# Patient Record
Sex: Female | Born: 1935 | Race: White | Hispanic: No | Smoking: Never smoker
Health system: Southern US, Community
[De-identification: ages and names within clinical notes are randomized; demographics above are authoritative.]

## PROBLEM LIST (undated history)

## (undated) DIAGNOSIS — IMO0001 Reserved for inherently not codable concepts without codable children: Secondary | ICD-10-CM

## (undated) DIAGNOSIS — I2510 Atherosclerotic heart disease of native coronary artery without angina pectoris: Secondary | ICD-10-CM

## (undated) DIAGNOSIS — N189 Chronic kidney disease, unspecified: Secondary | ICD-10-CM

## (undated) DIAGNOSIS — I4891 Unspecified atrial fibrillation: Secondary | ICD-10-CM

## (undated) DIAGNOSIS — K219 Gastro-esophageal reflux disease without esophagitis: Secondary | ICD-10-CM

## (undated) DIAGNOSIS — I509 Heart failure, unspecified: Secondary | ICD-10-CM

## (undated) DIAGNOSIS — J449 Chronic obstructive pulmonary disease, unspecified: Secondary | ICD-10-CM

## (undated) DIAGNOSIS — I10 Essential (primary) hypertension: Secondary | ICD-10-CM

## (undated) DIAGNOSIS — I209 Angina pectoris, unspecified: Secondary | ICD-10-CM

## (undated) DIAGNOSIS — R011 Cardiac murmur, unspecified: Secondary | ICD-10-CM

## (undated) HISTORY — PX: TUBAL LIGATION: SHX77

## (undated) HISTORY — PX: KNEE SURGERY: SHX244

## (undated) HISTORY — PX: CHOLECYSTECTOMY: SHX55

## (undated) HISTORY — PX: ABDOMINAL HYSTERECTOMY: SHX81

## (undated) HISTORY — PX: CORONARY ANGIOPLASTY: SHX604

---

## 2003-06-06 ENCOUNTER — Other Ambulatory Visit: Payer: Self-pay

## 2003-10-23 ENCOUNTER — Other Ambulatory Visit: Payer: Self-pay

## 2005-01-11 ENCOUNTER — Other Ambulatory Visit: Payer: Self-pay

## 2005-01-11 ENCOUNTER — Inpatient Hospital Stay: Payer: Self-pay | Admitting: Infectious Diseases

## 2005-01-20 ENCOUNTER — Inpatient Hospital Stay: Payer: Self-pay | Admitting: Internal Medicine

## 2005-05-07 ENCOUNTER — Emergency Department: Payer: Self-pay | Admitting: Emergency Medicine

## 2005-05-07 ENCOUNTER — Ambulatory Visit: Payer: Self-pay | Admitting: Family Medicine

## 2005-05-08 ENCOUNTER — Other Ambulatory Visit: Payer: Self-pay

## 2005-05-13 ENCOUNTER — Other Ambulatory Visit: Payer: Self-pay

## 2005-05-13 ENCOUNTER — Inpatient Hospital Stay: Payer: Self-pay | Admitting: Internal Medicine

## 2005-06-21 ENCOUNTER — Other Ambulatory Visit: Payer: Self-pay

## 2005-06-21 ENCOUNTER — Inpatient Hospital Stay: Payer: Self-pay | Admitting: Internal Medicine

## 2005-08-02 ENCOUNTER — Ambulatory Visit: Payer: Self-pay | Admitting: Family Medicine

## 2006-01-25 ENCOUNTER — Ambulatory Visit: Payer: Self-pay | Admitting: Family Medicine

## 2006-10-27 IMAGING — CR METASTATIC BONE SURVEY
1 series · 8 of 8 positions shown · non-contrast
Comparison: none

REASON FOR EXAM: anemia, hypercalcemia, renal insufficiency, r/o multiple
myeloma
COMMENTS:  LMP: Post-Menopausal

PROCEDURE:     DXR - DXR BONE SURVEY COMPLETE  - January 14, 2005 [DATE]
RESULT:     Multiple images are obtained of the entire axial and
appendicular skeleton.  No significant bony abnormalities or lucencies are
noted to suggest multiple myeloma.

[Series 1: view not recorded · 0.17mm/px · 8 of 20 slices shown]
[im 1/20]
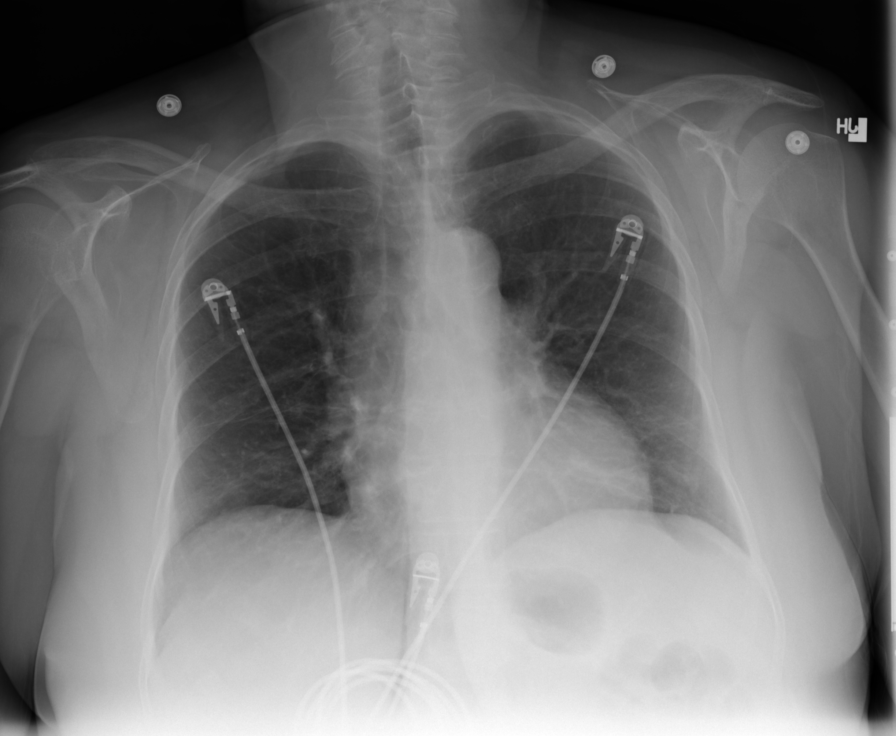
[im 3/20]
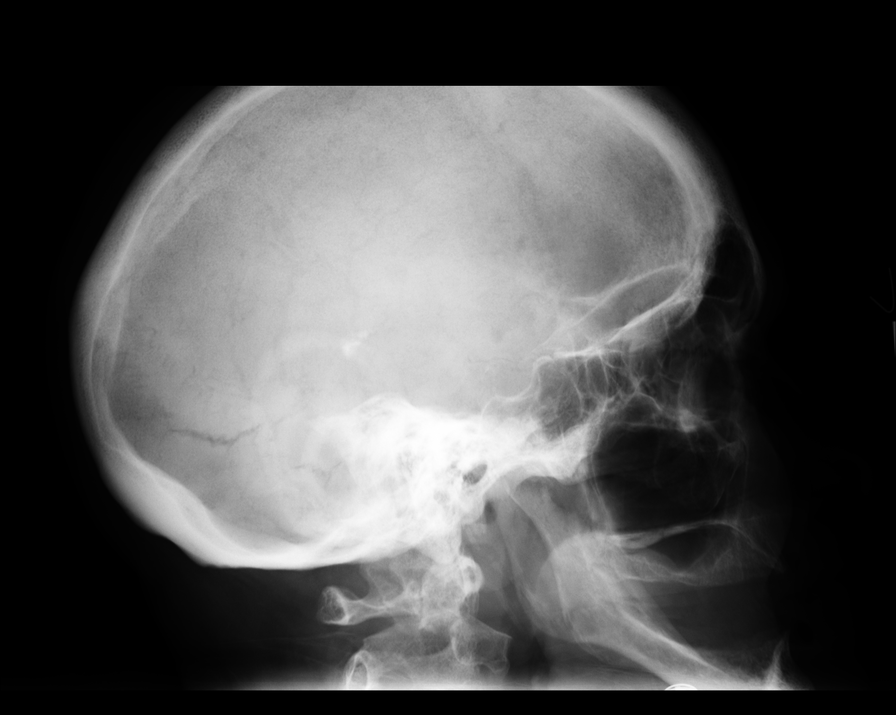
[im 6/20]
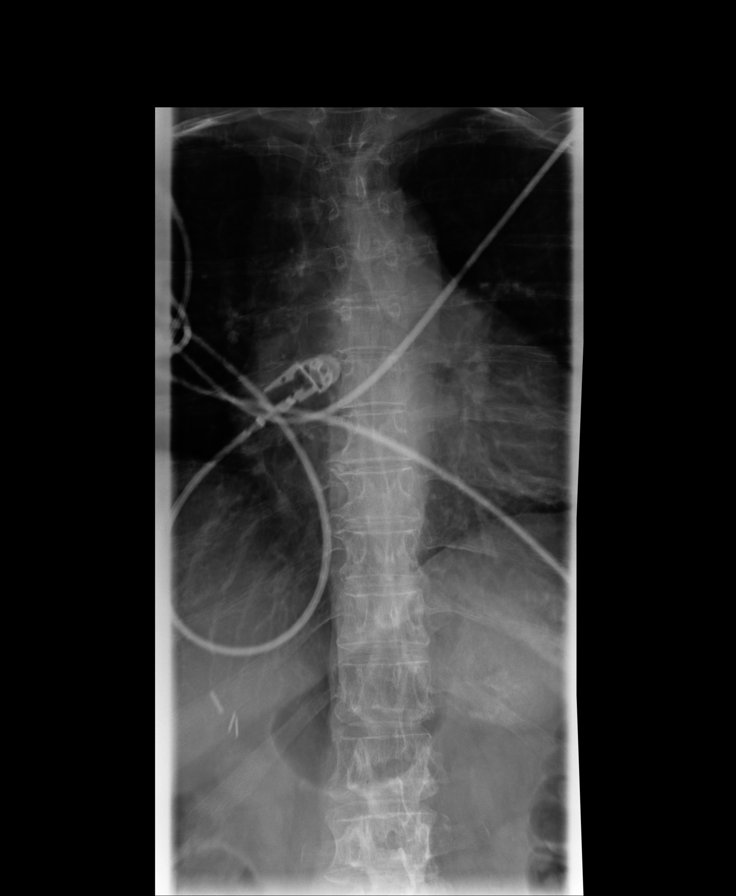
[im 9/20]
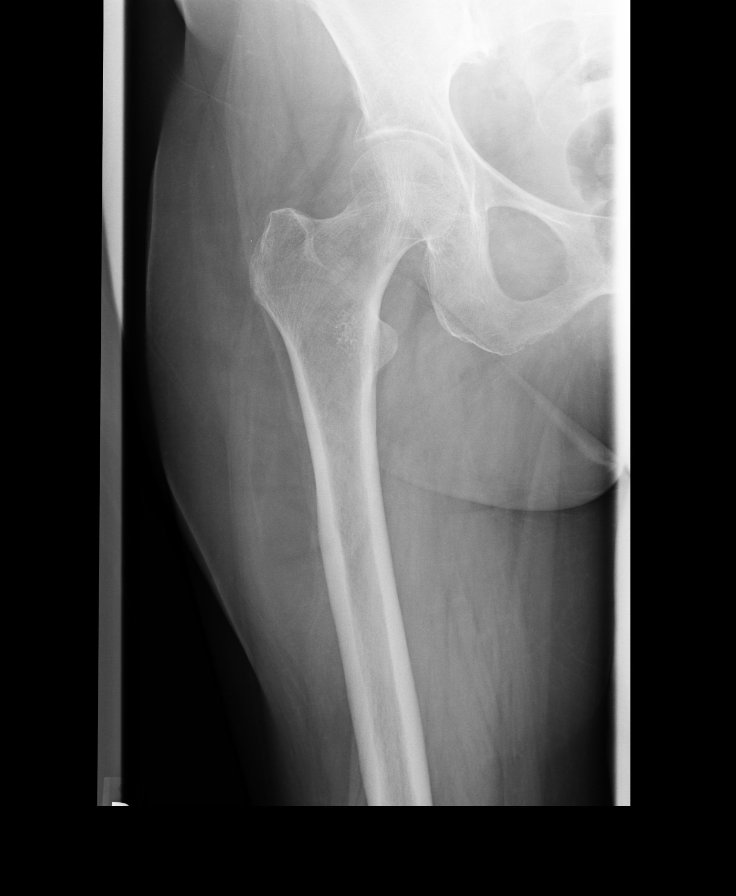
[im 11/20]
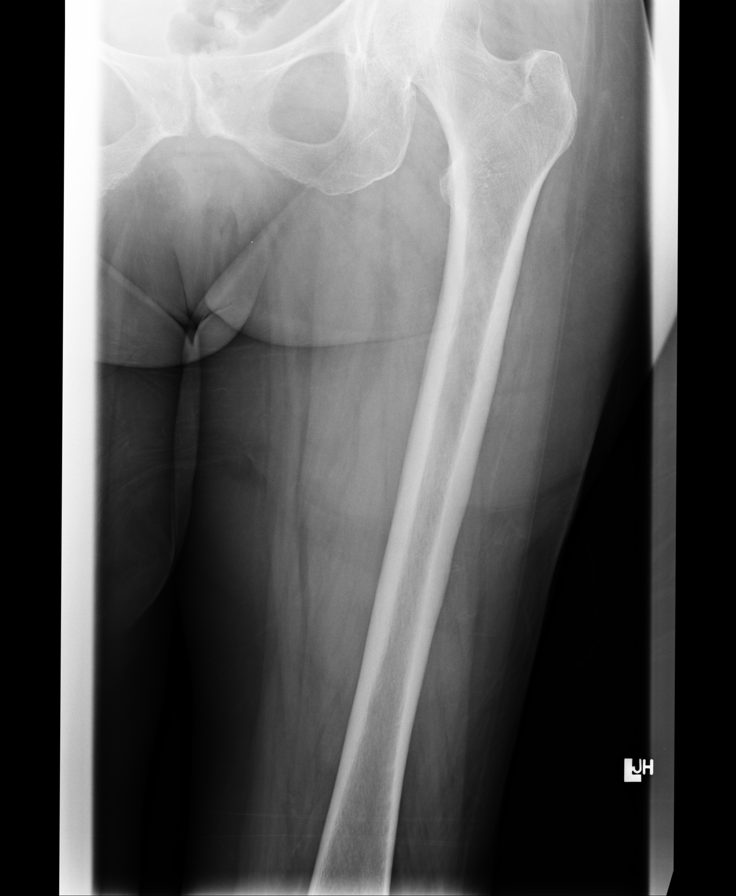
[im 14/20]
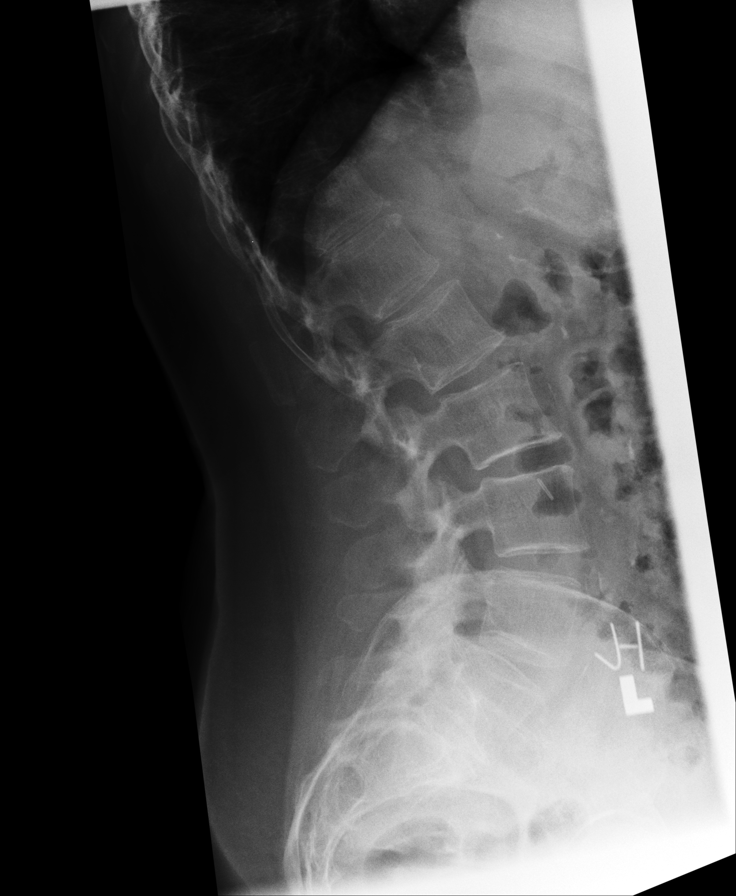
[im 17/20]
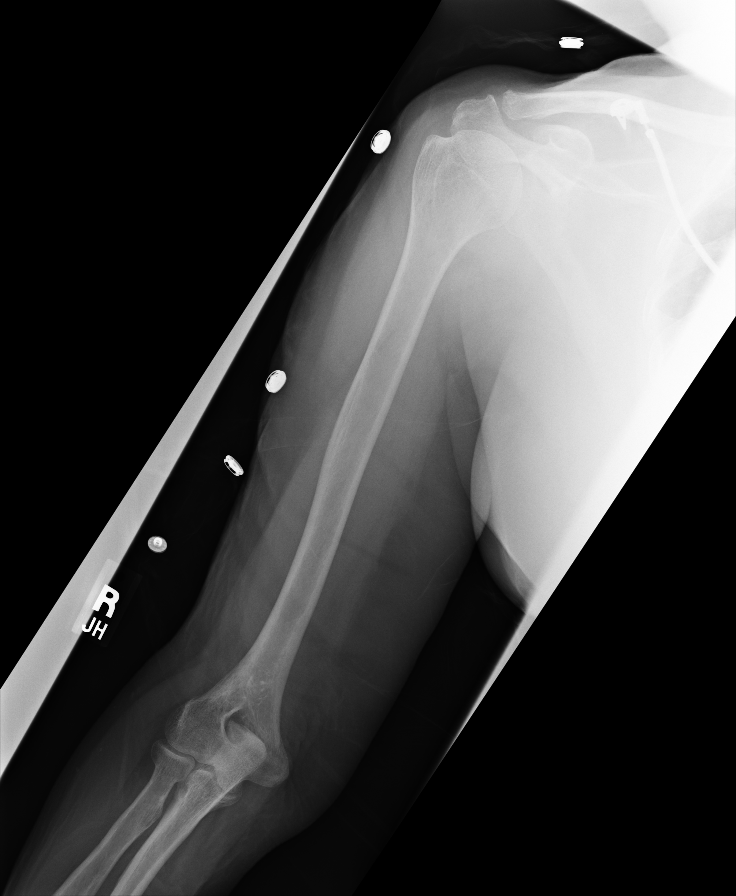
[im 20/20]
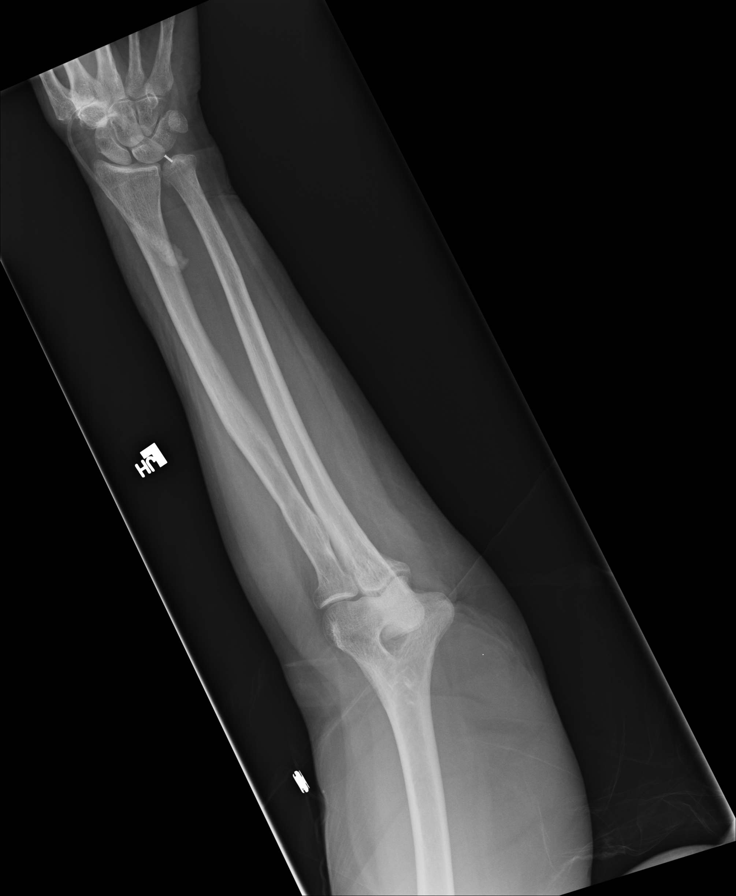

[8 of 8 positions shown; findings below may reference images not displayed]

IMPRESSION: 1)No definite evidence of multiple myeloma.

## 2006-10-28 IMAGING — US US RENAL KIDNEY
1 series · 17 of 23 positions shown · non-contrast
Comparison: none

REASON FOR EXAM: Renal failure
COMMENTS:

[Series 1: us renal kidney · 17 of 23 slices shown]
[im 1/23]
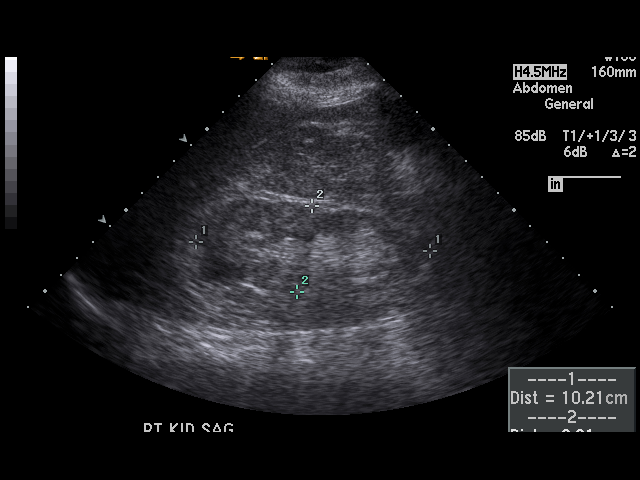
[im 3/23]
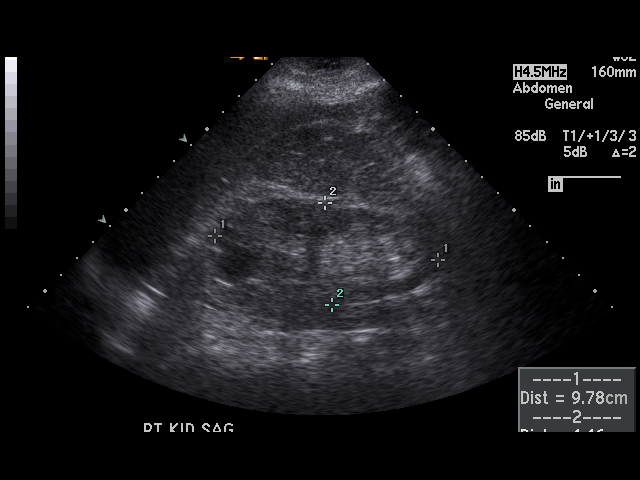
[im 4/23]
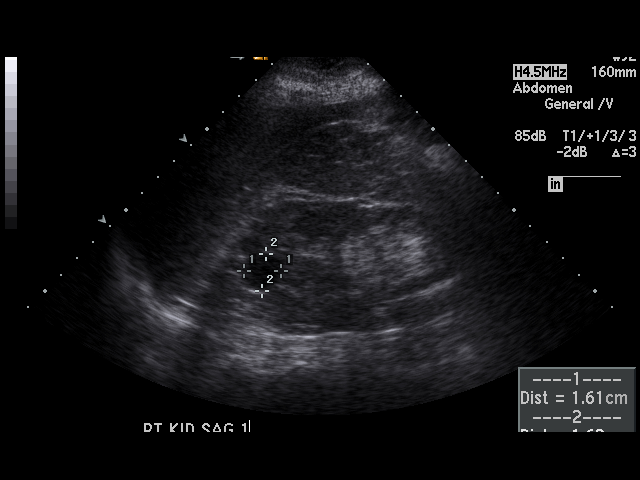
[im 5/23]
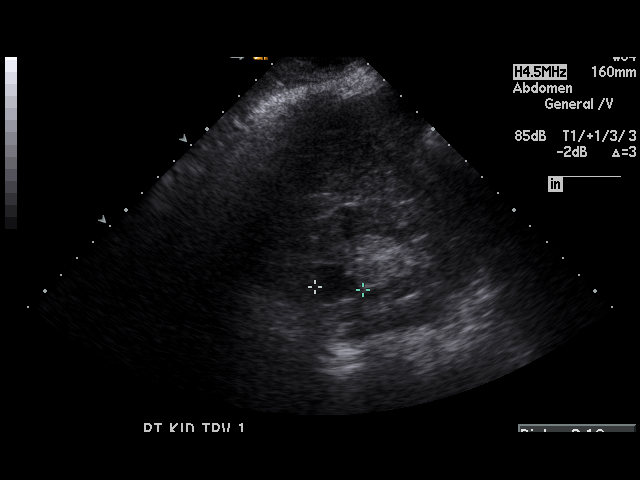
[im 7/23]
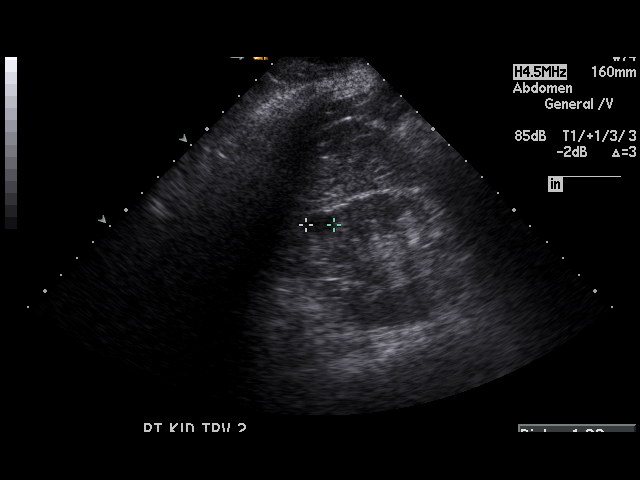
[im 8/23]
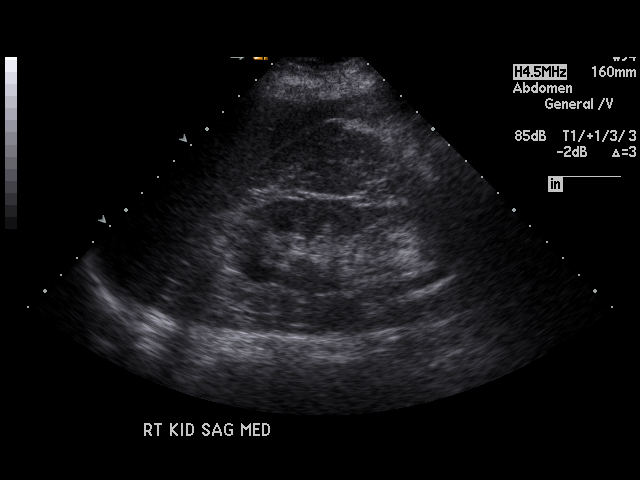
[im 9/23]
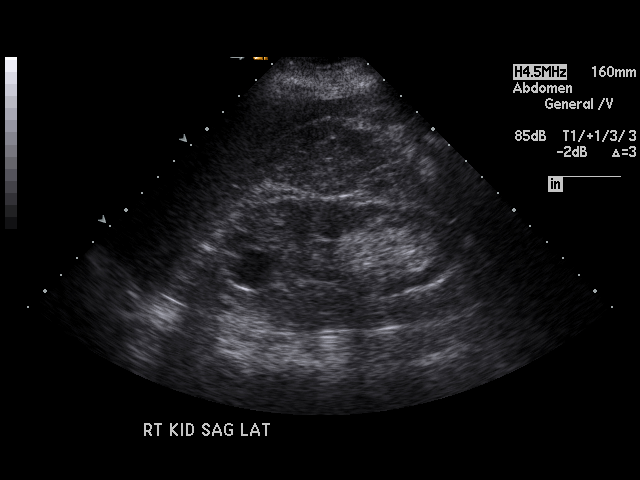
[im 11/23]
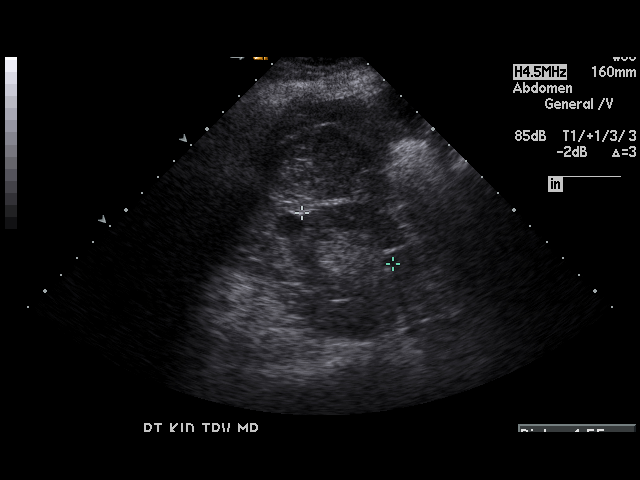
[im 12/23]
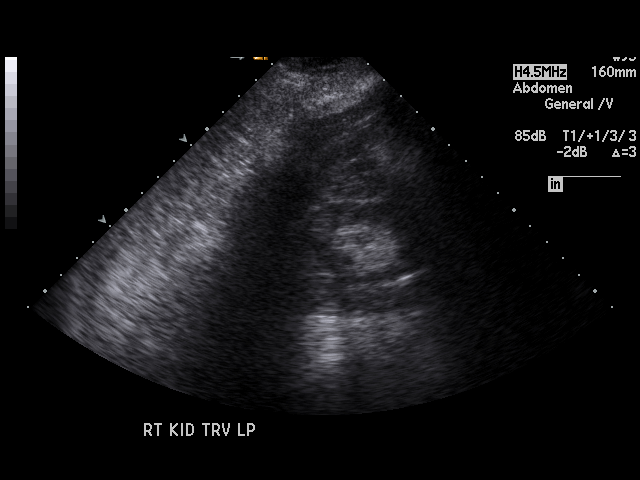
[im 13/23]
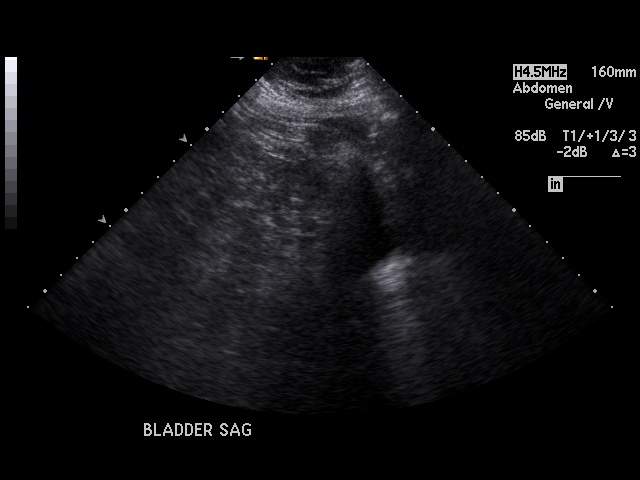
[im 15/23]
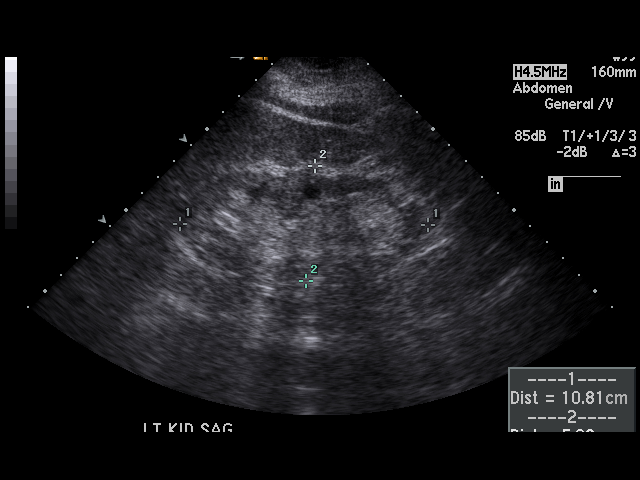
[im 16/23]
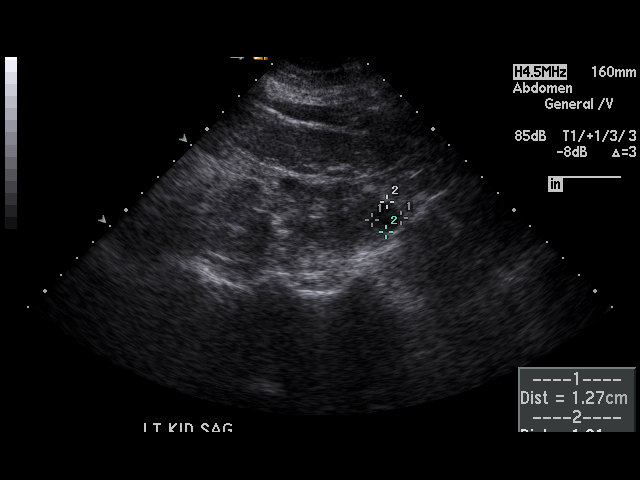
[im 17/23]
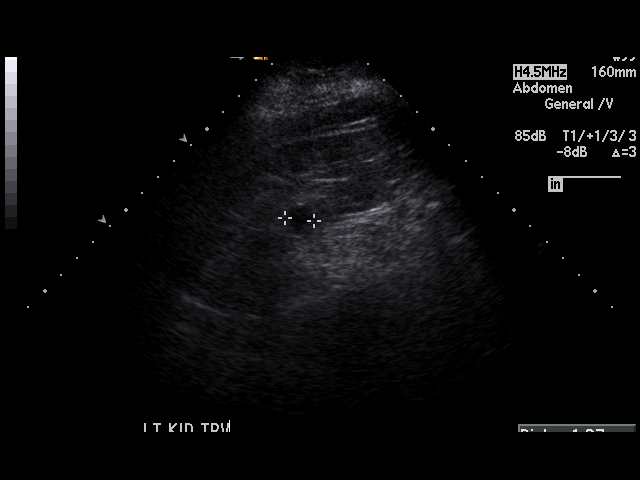
[im 19/23]
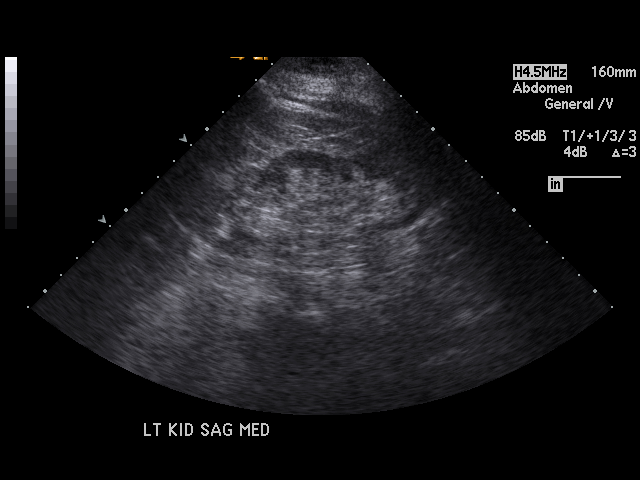
[im 20/23]
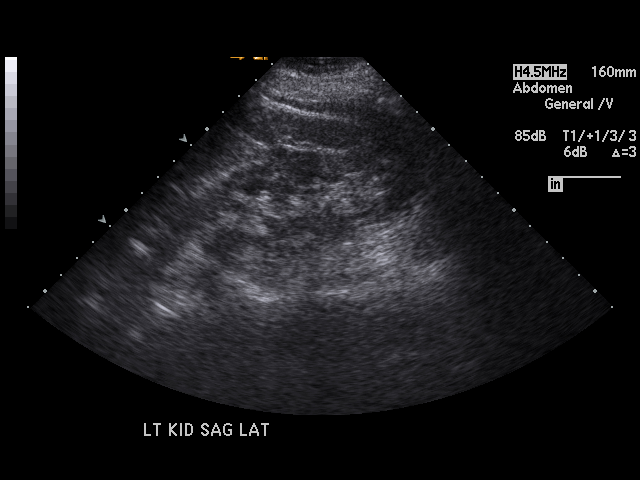
[im 21/23]
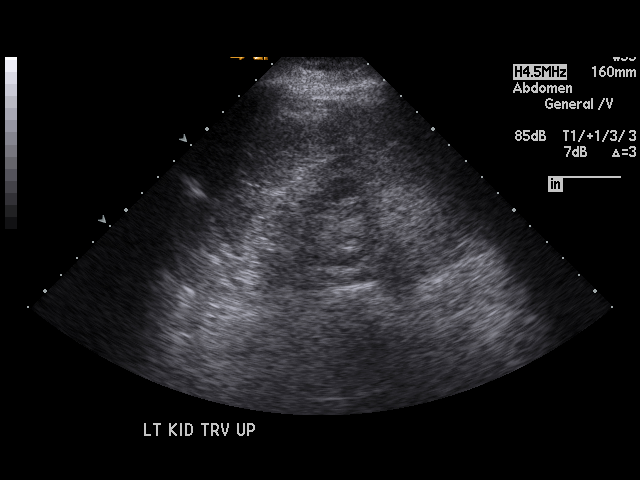
[im 23/23]
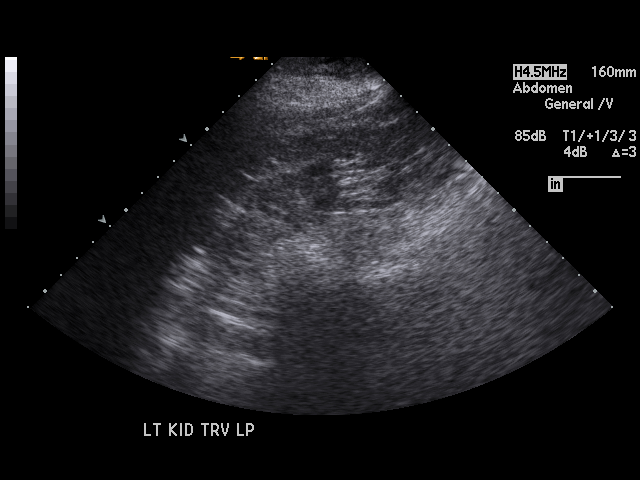

[17 of 23 positions shown; findings below may reference images not displayed]

PROCEDURE:     US  - US KIDNEY BILATERAL  - January 15, 2005  [DATE]

RESULT:     Real-time imaging was obtained.  The RIGHT kidney measures
x 4.4 x 4.6 cm while the LEFT kidney measures 10.8 x 5.0 x 4.3 cm.  Within
the RIGHT kidney are two cysts, one in the upper pole measuring 1.6 x 1.6 x
2.1 cm and the other in the midpole measuring .94 x 1.1 x 1.2 cm.  In the
lower pole of the LEFT kidney is a cyst measuring 1.27 x 1.3 x 1.27.  The
kidneys appear somewhat echogenic and may reflect medical renal disease.
IMPRESSION: No hydronephrosis is identified.

Small cysts are noted bilaterally.

## 2006-11-18 ENCOUNTER — Inpatient Hospital Stay: Payer: Self-pay | Admitting: Internal Medicine

## 2006-11-18 ENCOUNTER — Other Ambulatory Visit: Payer: Self-pay

## 2007-01-27 ENCOUNTER — Ambulatory Visit: Payer: Self-pay | Admitting: Family Medicine

## 2007-07-08 ENCOUNTER — Ambulatory Visit: Payer: Self-pay | Admitting: Urology

## 2007-10-07 ENCOUNTER — Other Ambulatory Visit: Payer: Self-pay

## 2007-10-07 ENCOUNTER — Inpatient Hospital Stay: Payer: Self-pay | Admitting: Internal Medicine

## 2007-10-08 ENCOUNTER — Other Ambulatory Visit: Payer: Self-pay

## 2008-01-29 ENCOUNTER — Ambulatory Visit: Payer: Self-pay | Admitting: Family Medicine

## 2009-01-31 ENCOUNTER — Ambulatory Visit: Payer: Self-pay | Admitting: Family Medicine

## 2009-12-13 ENCOUNTER — Inpatient Hospital Stay: Payer: Self-pay | Admitting: Internal Medicine

## 2010-05-26 ENCOUNTER — Ambulatory Visit: Payer: Self-pay | Admitting: Family Medicine

## 2010-07-04 ENCOUNTER — Ambulatory Visit: Payer: Self-pay | Admitting: Family Medicine

## 2010-07-17 ENCOUNTER — Emergency Department: Payer: Self-pay | Admitting: Emergency Medicine

## 2011-02-15 ENCOUNTER — Ambulatory Visit: Payer: Self-pay | Admitting: Family Medicine

## 2011-03-23 ENCOUNTER — Ambulatory Visit: Payer: Self-pay | Admitting: Unknown Physician Specialty

## 2011-03-27 LAB — PATHOLOGY REPORT

## 2011-06-07 ENCOUNTER — Ambulatory Visit: Payer: Self-pay | Admitting: Family Medicine

## 2011-06-13 ENCOUNTER — Ambulatory Visit: Payer: Self-pay | Admitting: Family Medicine

## 2012-06-30 ENCOUNTER — Ambulatory Visit: Payer: Self-pay | Admitting: Family Medicine

## 2013-06-24 ENCOUNTER — Emergency Department: Payer: Self-pay | Admitting: Internal Medicine

## 2013-06-24 LAB — URINALYSIS, COMPLETE
Bilirubin,UR: NEGATIVE
GLUCOSE, UR: NEGATIVE mg/dL (ref 0–75)
Hyaline Cast: 1
Nitrite: NEGATIVE
Ph: 7 (ref 4.5–8.0)
Protein: 100
RBC,UR: 4 /HPF (ref 0–5)
Specific Gravity: 1.009 (ref 1.003–1.030)
Squamous Epithelial: NONE SEEN
WBC UR: 19 /HPF (ref 0–5)

## 2013-06-24 LAB — COMPREHENSIVE METABOLIC PANEL
AST: 32 U/L (ref 15–37)
Albumin: 4.4 g/dL (ref 3.4–5.0)
Alkaline Phosphatase: 134 U/L — ABNORMAL HIGH
Anion Gap: 7 (ref 7–16)
BILIRUBIN TOTAL: 0.5 mg/dL (ref 0.2–1.0)
BUN: 16 mg/dL (ref 7–18)
Calcium, Total: 10 mg/dL (ref 8.5–10.1)
Chloride: 103 mmol/L (ref 98–107)
Co2: 30 mmol/L (ref 21–32)
Creatinine: 0.94 mg/dL (ref 0.60–1.30)
GFR CALC NON AF AMER: 56 — AB
GLUCOSE: 158 mg/dL — AB (ref 65–99)
Osmolality: 284 (ref 275–301)
Potassium: 3.2 mmol/L — ABNORMAL LOW (ref 3.5–5.1)
SGPT (ALT): 16 U/L (ref 12–78)
SODIUM: 140 mmol/L (ref 136–145)
TOTAL PROTEIN: 8.6 g/dL — AB (ref 6.4–8.2)

## 2013-06-24 LAB — CBC WITH DIFFERENTIAL/PLATELET
BASOS PCT: 0.4 %
Basophil #: 0 10*3/uL (ref 0.0–0.1)
EOS ABS: 0 10*3/uL (ref 0.0–0.7)
EOS PCT: 0 %
HCT: 43.8 % (ref 35.0–47.0)
HGB: 14.7 g/dL (ref 12.0–16.0)
Lymphocyte #: 0.7 10*3/uL — ABNORMAL LOW (ref 1.0–3.6)
Lymphocyte %: 9.7 %
MCH: 29.5 pg (ref 26.0–34.0)
MCHC: 33.5 g/dL (ref 32.0–36.0)
MCV: 88 fL (ref 80–100)
Monocyte #: 0.2 x10 3/mm (ref 0.2–0.9)
Monocyte %: 2.9 %
NEUTROS PCT: 87 %
Neutrophil #: 6.1 10*3/uL (ref 1.4–6.5)
Platelet: 187 10*3/uL (ref 150–440)
RBC: 4.96 10*6/uL (ref 3.80–5.20)
RDW: 13.8 % (ref 11.5–14.5)
WBC: 7 10*3/uL (ref 3.6–11.0)

## 2013-06-24 LAB — LIPASE, BLOOD: Lipase: 113 U/L (ref 73–393)

## 2013-08-15 ENCOUNTER — Observation Stay: Payer: Self-pay | Admitting: Internal Medicine

## 2013-08-15 ENCOUNTER — Other Ambulatory Visit: Payer: Self-pay | Admitting: Family Medicine

## 2013-08-15 LAB — COMPREHENSIVE METABOLIC PANEL
ANION GAP: 7 (ref 7–16)
Albumin: 3.2 g/dL — ABNORMAL LOW (ref 3.4–5.0)
Alkaline Phosphatase: 79 U/L
BUN: 24 mg/dL — AB (ref 7–18)
Bilirubin,Total: 0.4 mg/dL (ref 0.2–1.0)
CREATININE: 1.63 mg/dL — AB (ref 0.60–1.30)
Calcium, Total: 8.2 mg/dL — ABNORMAL LOW (ref 8.5–10.1)
Chloride: 108 mmol/L — ABNORMAL HIGH (ref 98–107)
Co2: 25 mmol/L (ref 21–32)
EGFR (African American): 33 — ABNORMAL LOW
GFR CALC NON AF AMER: 29 — AB
GLUCOSE: 99 mg/dL (ref 65–99)
Osmolality: 283 (ref 275–301)
Potassium: 3.3 mmol/L — ABNORMAL LOW (ref 3.5–5.1)
SGOT(AST): 41 U/L — ABNORMAL HIGH (ref 15–37)
SGPT (ALT): 16 U/L (ref 12–78)
SODIUM: 140 mmol/L (ref 136–145)
TOTAL PROTEIN: 6.9 g/dL (ref 6.4–8.2)

## 2013-08-15 LAB — URINALYSIS, COMPLETE
BACTERIA: NONE SEEN
Bilirubin,UR: NEGATIVE
Blood: NEGATIVE
Glucose,UR: NEGATIVE mg/dL (ref 0–75)
Ketone: NEGATIVE
LEUKOCYTE ESTERASE: NEGATIVE
Nitrite: NEGATIVE
Ph: 5 (ref 4.5–8.0)
Protein: 30
RBC,UR: 2 /HPF (ref 0–5)
Specific Gravity: 1.021 (ref 1.003–1.030)
WBC UR: 6 /HPF (ref 0–5)

## 2013-08-15 LAB — CBC
HCT: 36.6 % (ref 35.0–47.0)
HGB: 12.5 g/dL (ref 12.0–16.0)
MCH: 30.4 pg (ref 26.0–34.0)
MCHC: 34.1 g/dL (ref 32.0–36.0)
MCV: 89 fL (ref 80–100)
Platelet: 107 10*3/uL — ABNORMAL LOW (ref 150–440)
RBC: 4.11 10*6/uL (ref 3.80–5.20)
RDW: 14 % (ref 11.5–14.5)
WBC: 3.2 10*3/uL — ABNORMAL LOW (ref 3.6–11.0)

## 2013-08-15 LAB — CK TOTAL AND CKMB (NOT AT ARMC)
CK, TOTAL: 177 U/L
CK, TOTAL: 150 U/L
CK, Total: 168 U/L
CK-MB: 6.2 ng/mL — ABNORMAL HIGH (ref 0.5–3.6)
CK-MB: 5.3 ng/mL — ABNORMAL HIGH (ref 0.5–3.6)
CK-MB: 6.1 ng/mL — ABNORMAL HIGH (ref 0.5–3.6)

## 2013-08-15 LAB — TROPONIN I
Troponin-I: <0.02 ng/mL
Troponin-I: <0.02 ng/mL
Troponin-I: <0.02 ng/mL
Troponin-I: <0.02 ng/mL

## 2013-08-15 LAB — PRO B NATRIURETIC PEPTIDE
B-TYPE NATIURETIC PEPTID: 5911 pg/mL — AB (ref 0–450)
B-Type Natriuretic Peptide: 5841 pg/mL — ABNORMAL HIGH (ref 0–450)

## 2013-08-15 LAB — TSH: Thyroid Stimulating Horm: 1.3 u[IU]/mL

## 2013-08-15 LAB — MAGNESIUM: MAGNESIUM: 1.8 mg/dL

## 2013-08-16 LAB — CBC WITH DIFFERENTIAL/PLATELET
BASOS ABS: 0 10*3/uL (ref 0.0–0.1)
Basophil %: 0.3 %
EOS PCT: 0.7 %
Eosinophil #: 0 10*3/uL (ref 0.0–0.7)
HCT: 36 % (ref 35.0–47.0)
HGB: 11.9 g/dL — ABNORMAL LOW (ref 12.0–16.0)
LYMPHS ABS: 0.7 10*3/uL — AB (ref 1.0–3.6)
Lymphocyte %: 31.8 %
MCH: 29.4 pg (ref 26.0–34.0)
MCHC: 33.1 g/dL (ref 32.0–36.0)
MCV: 89 fL (ref 80–100)
MONOS PCT: 12.2 %
Monocyte #: 0.3 x10 3/mm (ref 0.2–0.9)
NEUTROS ABS: 1.2 10*3/uL — AB (ref 1.4–6.5)
NEUTROS PCT: 55 %
Platelet: 89 10*3/uL — ABNORMAL LOW (ref 150–440)
RBC: 4.04 10*6/uL (ref 3.80–5.20)
RDW: 13.8 % (ref 11.5–14.5)
WBC: 2.2 10*3/uL — ABNORMAL LOW (ref 3.6–11.0)

## 2013-08-16 LAB — BASIC METABOLIC PANEL
Anion Gap: 5 — ABNORMAL LOW (ref 7–16)
BUN: 23 mg/dL — ABNORMAL HIGH (ref 7–18)
CALCIUM: 8 mg/dL — AB (ref 8.5–10.1)
CHLORIDE: 109 mmol/L — AB (ref 98–107)
CREATININE: 1.39 mg/dL — AB (ref 0.60–1.30)
Co2: 27 mmol/L (ref 21–32)
GFR CALC AF AMER: 41 — AB
GFR CALC NON AF AMER: 35 — AB
Glucose: 92 mg/dL (ref 65–99)
Osmolality: 285 (ref 275–301)
Potassium: 4 mmol/L (ref 3.5–5.1)
SODIUM: 141 mmol/L (ref 136–145)

## 2013-08-25 ENCOUNTER — Ambulatory Visit: Payer: Self-pay | Admitting: Family Medicine

## 2013-10-08 ENCOUNTER — Emergency Department: Payer: Self-pay | Admitting: Emergency Medicine

## 2013-10-08 LAB — CBC WITH DIFFERENTIAL/PLATELET
Basophil #: 0 10*3/uL (ref 0.0–0.1)
Basophil %: 0.4 %
EOS ABS: 0.2 10*3/uL (ref 0.0–0.7)
EOS PCT: 4.1 %
HCT: 41.3 % (ref 35.0–47.0)
HGB: 13.4 g/dL (ref 12.0–16.0)
LYMPHS ABS: 1.5 10*3/uL (ref 1.0–3.6)
Lymphocyte %: 36.3 %
MCH: 29.2 pg (ref 26.0–34.0)
MCHC: 32.5 g/dL (ref 32.0–36.0)
MCV: 90 fL (ref 80–100)
Monocyte #: 0.3 x10 3/mm (ref 0.2–0.9)
Monocyte %: 7.2 %
NEUTROS ABS: 2.2 10*3/uL (ref 1.4–6.5)
Neutrophil %: 52 %
PLATELETS: 155 10*3/uL (ref 150–440)
RBC: 4.58 10*6/uL (ref 3.80–5.20)
RDW: 15.1 % — ABNORMAL HIGH (ref 11.5–14.5)
WBC: 4.2 10*3/uL (ref 3.6–11.0)

## 2013-10-08 LAB — COMPREHENSIVE METABOLIC PANEL
ALBUMIN: 4 g/dL (ref 3.4–5.0)
ALK PHOS: 175 U/L — AB
ALT: 18 U/L (ref 12–78)
AST: 24 U/L (ref 15–37)
Anion Gap: 9 (ref 7–16)
BUN: 18 mg/dL (ref 7–18)
Bilirubin,Total: 0.3 mg/dL (ref 0.2–1.0)
CHLORIDE: 103 mmol/L (ref 98–107)
CO2: 29 mmol/L (ref 21–32)
CREATININE: 0.87 mg/dL (ref 0.60–1.30)
Calcium, Total: 10.2 mg/dL — ABNORMAL HIGH (ref 8.5–10.1)
EGFR (African American): >60
EGFR (Non-African Amer.): >60
GLUCOSE: 111 mg/dL — AB (ref 65–99)
Osmolality: 284 (ref 275–301)
Potassium: 3.4 mmol/L — ABNORMAL LOW (ref 3.5–5.1)
Sodium: 141 mmol/L (ref 136–145)
Total Protein: 8.3 g/dL — ABNORMAL HIGH (ref 6.4–8.2)

## 2013-10-08 LAB — URINALYSIS, COMPLETE
BILIRUBIN, UR: NEGATIVE
Bacteria: NONE SEEN
GLUCOSE, UR: NEGATIVE mg/dL (ref 0–75)
Ketone: NEGATIVE
Nitrite: NEGATIVE
Ph: 6 (ref 4.5–8.0)
RBC,UR: 3 /HPF (ref 0–5)
SPECIFIC GRAVITY: 1.01 (ref 1.003–1.030)
Squamous Epithelial: 1
WBC UR: 6 /HPF (ref 0–5)

## 2013-10-08 LAB — LIPASE, BLOOD: Lipase: 459 U/L — ABNORMAL HIGH (ref 73–393)

## 2013-10-09 LAB — CLOSTRIDIUM DIFFICILE(ARMC)

## 2014-02-16 ENCOUNTER — Emergency Department: Payer: Self-pay | Admitting: Emergency Medicine

## 2014-02-16 LAB — URINALYSIS, COMPLETE
Bilirubin,UR: NEGATIVE
Glucose,UR: NEGATIVE mg/dL (ref 0–75)
KETONE: NEGATIVE
Nitrite: NEGATIVE
PH: 6 (ref 4.5–8.0)
Protein: 30
RBC,UR: 1 /HPF (ref 0–5)
Specific Gravity: 1.005 (ref 1.003–1.030)
Squamous Epithelial: 1
WBC UR: 13 /HPF (ref 0–5)

## 2014-02-16 LAB — BASIC METABOLIC PANEL
ANION GAP: 5 — AB (ref 7–16)
BUN: 13 mg/dL (ref 7–18)
CHLORIDE: 105 mmol/L (ref 98–107)
Calcium, Total: 9.4 mg/dL (ref 8.5–10.1)
Co2: 31 mmol/L (ref 21–32)
Creatinine: 0.89 mg/dL (ref 0.60–1.30)
EGFR (African American): >60
EGFR (Non-African Amer.): 60 — ABNORMAL LOW
GLUCOSE: 116 mg/dL — AB (ref 65–99)
OSMOLALITY: 282 (ref 275–301)
POTASSIUM: 3.3 mmol/L — AB (ref 3.5–5.1)
Sodium: 141 mmol/L (ref 136–145)

## 2014-02-16 LAB — CBC WITH DIFFERENTIAL/PLATELET
Basophil #: 0 10*3/uL (ref 0.0–0.1)
Basophil %: 0.4 %
Eosinophil #: 0.1 10*3/uL (ref 0.0–0.7)
Eosinophil %: 3.4 %
HCT: 39.6 % (ref 35.0–47.0)
HGB: 13.2 g/dL (ref 12.0–16.0)
LYMPHS PCT: 32.9 %
Lymphocyte #: 1.1 10*3/uL (ref 1.0–3.6)
MCH: 30.3 pg (ref 26.0–34.0)
MCHC: 33.3 g/dL (ref 32.0–36.0)
MCV: 91 fL (ref 80–100)
MONO ABS: 0.2 x10 3/mm (ref 0.2–0.9)
Monocyte %: 6.3 %
NEUTROS ABS: 1.9 10*3/uL (ref 1.4–6.5)
NEUTROS PCT: 57 %
PLATELETS: 148 10*3/uL — AB (ref 150–440)
RBC: 4.35 10*6/uL (ref 3.80–5.20)
RDW: 14.3 % (ref 11.5–14.5)
WBC: 3.3 10*3/uL — ABNORMAL LOW (ref 3.6–11.0)

## 2014-02-16 LAB — TROPONIN I: Troponin-I: <0.02 ng/mL

## 2014-02-18 ENCOUNTER — Inpatient Hospital Stay: Payer: Self-pay | Admitting: Specialist

## 2014-02-18 LAB — COMPREHENSIVE METABOLIC PANEL
ALBUMIN: 4.1 g/dL (ref 3.4–5.0)
ALK PHOS: 179 U/L — AB
AST: 34 U/L (ref 15–37)
Anion Gap: 9 (ref 7–16)
BUN: 11 mg/dL (ref 7–18)
Bilirubin,Total: 0.6 mg/dL (ref 0.2–1.0)
CHLORIDE: 105 mmol/L (ref 98–107)
CO2: 28 mmol/L (ref 21–32)
Calcium, Total: 9.5 mg/dL (ref 8.5–10.1)
Creatinine: 0.71 mg/dL (ref 0.60–1.30)
EGFR (African American): >60
EGFR (Non-African Amer.): >60
GLUCOSE: 109 mg/dL — AB (ref 65–99)
Osmolality: 283 (ref 275–301)
POTASSIUM: 3.5 mmol/L (ref 3.5–5.1)
SGPT (ALT): 13 U/L — ABNORMAL LOW
Sodium: 142 mmol/L (ref 136–145)
TOTAL PROTEIN: 8.3 g/dL — AB (ref 6.4–8.2)

## 2014-02-18 LAB — CBC
HCT: 47.2 % — AB (ref 35.0–47.0)
HGB: 15.3 g/dL (ref 12.0–16.0)
MCH: 29.6 pg (ref 26.0–34.0)
MCHC: 32.3 g/dL (ref 32.0–36.0)
MCV: 92 fL (ref 80–100)
Platelet: 161 10*3/uL (ref 150–440)
RBC: 5.15 10*6/uL (ref 3.80–5.20)
RDW: 14.5 % (ref 11.5–14.5)
WBC: 4.7 10*3/uL (ref 3.6–11.0)

## 2014-02-18 LAB — APTT: Activated PTT: 36.5 secs — ABNORMAL HIGH (ref 23.6–35.9)

## 2014-02-18 LAB — TROPONIN I
TROPONIN-I: 0.05 ng/mL
Troponin-I: <0.02 ng/mL
Troponin-I: 0.04 ng/mL

## 2014-02-18 LAB — CK-MB
CK-MB: 3.6 ng/mL (ref 0.5–3.6)
CK-MB: 3.4 ng/mL (ref 0.5–3.6)

## 2014-02-18 LAB — PROTIME-INR
INR: 1
PROTHROMBIN TIME: 13.3 s (ref 11.5–14.7)

## 2014-02-18 LAB — CK TOTAL AND CKMB (NOT AT ARMC)
CK, TOTAL: 80 U/L
CK-MB: 3 ng/mL (ref 0.5–3.6)

## 2014-02-18 LAB — URINE CULTURE

## 2014-02-19 LAB — CBC WITH DIFFERENTIAL/PLATELET
BASOS ABS: 0 10*3/uL (ref 0.0–0.1)
BASOS PCT: 0.2 %
Eosinophil #: 0.1 10*3/uL (ref 0.0–0.7)
Eosinophil %: 1.1 %
HCT: 43.8 % (ref 35.0–47.0)
HGB: 14.7 g/dL (ref 12.0–16.0)
Lymphocyte #: 1 10*3/uL (ref 1.0–3.6)
Lymphocyte %: 20.2 %
MCH: 30.6 pg (ref 26.0–34.0)
MCHC: 33.5 g/dL (ref 32.0–36.0)
MCV: 91 fL (ref 80–100)
MONOS PCT: 10 %
Monocyte #: 0.5 x10 3/mm (ref 0.2–0.9)
NEUTROS ABS: 3.3 10*3/uL (ref 1.4–6.5)
NEUTROS PCT: 68.5 %
Platelet: 170 10*3/uL (ref 150–440)
RBC: 4.79 10*6/uL (ref 3.80–5.20)
RDW: 14.2 % (ref 11.5–14.5)
WBC: 4.8 10*3/uL (ref 3.6–11.0)

## 2014-02-19 LAB — URINALYSIS, COMPLETE
Bilirubin,UR: NEGATIVE
Blood: NEGATIVE
Glucose,UR: NEGATIVE mg/dL (ref 0–75)
Hyaline Cast: 4
Ketone: NEGATIVE
Leukocyte Esterase: NEGATIVE
Nitrite: NEGATIVE
PH: 5 (ref 4.5–8.0)
Protein: 100
RBC,UR: 10 /HPF (ref 0–5)
SQUAMOUS EPITHELIAL: NONE SEEN
Specific Gravity: 1.018 (ref 1.003–1.030)

## 2014-02-19 LAB — BASIC METABOLIC PANEL
Anion Gap: 5 — ABNORMAL LOW (ref 7–16)
BUN: 22 mg/dL — ABNORMAL HIGH (ref 7–18)
CHLORIDE: 102 mmol/L (ref 98–107)
Calcium, Total: 9.8 mg/dL (ref 8.5–10.1)
Co2: 33 mmol/L — ABNORMAL HIGH (ref 21–32)
Creatinine: 1.18 mg/dL (ref 0.60–1.30)
EGFR (African American): 49 — ABNORMAL LOW
EGFR (Non-African Amer.): 43 — ABNORMAL LOW
GLUCOSE: 117 mg/dL — AB (ref 65–99)
Osmolality: 284 (ref 275–301)
Potassium: 3.6 mmol/L (ref 3.5–5.1)
Sodium: 140 mmol/L (ref 136–145)

## 2014-02-19 LAB — HEMOGLOBIN A1C: HEMOGLOBIN A1C: 5 % (ref 4.2–6.3)

## 2014-02-20 LAB — TROPONIN I
TROPONIN-I: 0.04 ng/mL
TROPONIN-I: 0.09 ng/mL — AB

## 2014-02-21 DIAGNOSIS — I4891 Unspecified atrial fibrillation: Secondary | ICD-10-CM

## 2014-02-21 LAB — TROPONIN I: Troponin-I: 0.15 ng/mL — ABNORMAL HIGH

## 2014-03-08 ENCOUNTER — Ambulatory Visit: Payer: Self-pay | Admitting: Family Medicine

## 2014-03-09 ENCOUNTER — Emergency Department: Payer: Self-pay | Admitting: Emergency Medicine

## 2014-03-09 LAB — CBC
HCT: 41 % (ref 35.0–47.0)
HGB: 13.7 g/dL (ref 12.0–16.0)
MCH: 30.7 pg (ref 26.0–34.0)
MCHC: 33.5 g/dL (ref 32.0–36.0)
MCV: 92 fL (ref 80–100)
Platelet: 171 10*3/uL (ref 150–440)
RBC: 4.48 10*6/uL (ref 3.80–5.20)
RDW: 15.3 % — ABNORMAL HIGH (ref 11.5–14.5)
WBC: 5.6 10*3/uL (ref 3.6–11.0)

## 2014-03-09 LAB — URINALYSIS, COMPLETE
BLOOD: NEGATIVE
Bilirubin,UR: NEGATIVE
Glucose,UR: NEGATIVE mg/dL (ref 0–75)
Hyaline Cast: 2
Ketone: NEGATIVE
NITRITE: NEGATIVE
Ph: 6 (ref 4.5–8.0)
Protein: NEGATIVE
RBC,UR: 1 /HPF (ref 0–5)
Specific Gravity: 1.008 (ref 1.003–1.030)
Squamous Epithelial: <1

## 2014-03-09 LAB — COMPREHENSIVE METABOLIC PANEL
ALBUMIN: 3.5 g/dL (ref 3.4–5.0)
Alkaline Phosphatase: 138 U/L — ABNORMAL HIGH
Anion Gap: 6 — ABNORMAL LOW (ref 7–16)
BILIRUBIN TOTAL: 0.4 mg/dL (ref 0.2–1.0)
BUN: 29 mg/dL — ABNORMAL HIGH (ref 7–18)
CREATININE: 1.47 mg/dL — AB (ref 0.60–1.30)
Calcium, Total: 8.9 mg/dL (ref 8.5–10.1)
Chloride: 104 mmol/L (ref 98–107)
Co2: 29 mmol/L (ref 21–32)
EGFR (African American): 44 — ABNORMAL LOW
GFR CALC NON AF AMER: 36 — AB
GLUCOSE: 97 mg/dL (ref 65–99)
OSMOLALITY: 283 (ref 275–301)
POTASSIUM: 4 mmol/L (ref 3.5–5.1)
SGOT(AST): 15 U/L (ref 15–37)
SGPT (ALT): 12 U/L — ABNORMAL LOW
Sodium: 139 mmol/L (ref 136–145)
TOTAL PROTEIN: 6.6 g/dL (ref 6.4–8.2)

## 2014-03-09 LAB — PROTIME-INR
INR: 1.4
Prothrombin Time: 16.7 secs — ABNORMAL HIGH (ref 11.5–14.7)

## 2014-03-09 LAB — TROPONIN I: Troponin-I: <0.02 ng/mL

## 2014-03-09 LAB — LIPASE, BLOOD: Lipase: 288 U/L (ref 73–393)

## 2014-03-24 ENCOUNTER — Emergency Department: Payer: Self-pay | Admitting: Emergency Medicine

## 2014-03-24 LAB — BASIC METABOLIC PANEL
Anion Gap: 8 (ref 7–16)
BUN: 14 mg/dL (ref 7–18)
CHLORIDE: 105 mmol/L (ref 98–107)
CO2: 29 mmol/L (ref 21–32)
CREATININE: 1.16 mg/dL (ref 0.60–1.30)
Calcium, Total: 8.8 mg/dL (ref 8.5–10.1)
GFR CALC AF AMER: 57 — AB
GFR CALC NON AF AMER: 47 — AB
GLUCOSE: 107 mg/dL — AB (ref 65–99)
OSMOLALITY: 284 (ref 275–301)
Potassium: 3.4 mmol/L — ABNORMAL LOW (ref 3.5–5.1)
Sodium: 142 mmol/L (ref 136–145)

## 2014-03-24 LAB — CBC
HCT: 40.5 % (ref 35.0–47.0)
HGB: 13.2 g/dL (ref 12.0–16.0)
MCH: 30 pg (ref 26.0–34.0)
MCHC: 32.6 g/dL (ref 32.0–36.0)
MCV: 92 fL (ref 80–100)
Platelet: 145 10*3/uL — ABNORMAL LOW (ref 150–440)
RBC: 4.41 10*6/uL (ref 3.80–5.20)
RDW: 14.6 % — ABNORMAL HIGH (ref 11.5–14.5)
WBC: 3.5 10*3/uL — ABNORMAL LOW (ref 3.6–11.0)

## 2014-03-24 LAB — HEPATIC FUNCTION PANEL A (ARMC)
ALK PHOS: 154 U/L — AB
AST: 21 U/L (ref 15–37)
Albumin: 3.6 g/dL (ref 3.4–5.0)
Bilirubin,Total: 0.3 mg/dL (ref 0.2–1.0)
SGPT (ALT): 14 U/L
TOTAL PROTEIN: 7.1 g/dL (ref 6.4–8.2)

## 2014-03-24 LAB — TROPONIN I
Troponin-I: <0.02 ng/mL
Troponin-I: <0.02 ng/mL

## 2014-03-24 LAB — PROTIME-INR
INR: 5.7
Prothrombin Time: 49.3 secs — ABNORMAL HIGH (ref 11.5–14.7)

## 2014-03-24 LAB — TSH: Thyroid Stimulating Horm: 4.24 u[IU]/mL

## 2014-04-01 ENCOUNTER — Ambulatory Visit: Payer: Self-pay | Admitting: Internal Medicine

## 2014-04-01 LAB — PROTIME-INR
INR: 1.1
Prothrombin Time: 13.8 secs (ref 11.5–14.7)

## 2014-04-05 ENCOUNTER — Emergency Department: Payer: Self-pay | Admitting: Emergency Medicine

## 2014-04-05 LAB — CBC WITH DIFFERENTIAL/PLATELET
Basophil #: 0 10*3/uL (ref 0.0–0.1)
Basophil %: 0.3 %
EOS PCT: 1.8 %
Eosinophil #: 0.1 10*3/uL (ref 0.0–0.7)
HCT: 37.2 % (ref 35.0–47.0)
HGB: 12.4 g/dL (ref 12.0–16.0)
LYMPHS PCT: 27.4 %
Lymphocyte #: 0.9 10*3/uL — ABNORMAL LOW (ref 1.0–3.6)
MCH: 30.9 pg (ref 26.0–34.0)
MCHC: 33.4 g/dL (ref 32.0–36.0)
MCV: 93 fL (ref 80–100)
Monocyte #: 0.3 x10 3/mm (ref 0.2–0.9)
Monocyte %: 8.1 %
Neutrophil #: 2.2 10*3/uL (ref 1.4–6.5)
Neutrophil %: 62.4 %
PLATELETS: 181 10*3/uL (ref 150–440)
RBC: 4.02 10*6/uL (ref 3.80–5.20)
RDW: 14.8 % — ABNORMAL HIGH (ref 11.5–14.5)
WBC: 3.5 10*3/uL — ABNORMAL LOW (ref 3.6–11.0)

## 2014-04-05 LAB — PRO B NATRIURETIC PEPTIDE: B-Type Natriuretic Peptide: 598 pg/mL — ABNORMAL HIGH (ref 0–450)

## 2014-04-05 LAB — PROTIME-INR
INR: 1.1
Prothrombin Time: 13.7 secs (ref 11.5–14.7)

## 2014-06-21 ENCOUNTER — Ambulatory Visit: Payer: Self-pay | Admitting: Family Medicine

## 2014-07-29 ENCOUNTER — Ambulatory Visit: Payer: Self-pay | Admitting: Family Medicine

## 2014-08-06 ENCOUNTER — Ambulatory Visit: Payer: Self-pay | Admitting: Internal Medicine

## 2014-08-15 ENCOUNTER — Emergency Department: Payer: Self-pay | Admitting: Emergency Medicine

## 2014-08-24 ENCOUNTER — Emergency Department: Payer: Self-pay | Admitting: Internal Medicine

## 2014-08-30 ENCOUNTER — Ambulatory Visit: Payer: Self-pay | Admitting: Family Medicine

## 2014-09-02 ENCOUNTER — Observation Stay: Payer: Self-pay | Admitting: Internal Medicine

## 2014-09-02 LAB — COMPREHENSIVE METABOLIC PANEL
ALBUMIN: 4.1 g/dL
ALT: 15 U/L
Alkaline Phosphatase: 162 U/L — ABNORMAL HIGH
Anion Gap: 11 (ref 7–16)
BUN: 24 mg/dL — AB
Bilirubin,Total: 0.7 mg/dL
CREATININE: 1.34 mg/dL — AB
Calcium, Total: 9.7 mg/dL
Chloride: 102 mmol/L
Co2: 29 mmol/L
EGFR (Non-African Amer.): 36 — ABNORMAL LOW
GFR CALC AF AMER: 42 — AB
Glucose: 113 mg/dL — ABNORMAL HIGH
Potassium: 3.5 mmol/L
SGOT(AST): 29 U/L
SODIUM: 142 mmol/L
TOTAL PROTEIN: 8.3 g/dL — AB

## 2014-09-02 LAB — CBC
HCT: 41.5 % (ref 35.0–47.0)
HGB: 13.5 g/dL (ref 12.0–16.0)
MCH: 29.6 pg (ref 26.0–34.0)
MCHC: 32.6 g/dL (ref 32.0–36.0)
MCV: 91 fL (ref 80–100)
Platelet: 232 10*3/uL (ref 150–440)
RBC: 4.57 10*6/uL (ref 3.80–5.20)
RDW: 15.8 % — ABNORMAL HIGH (ref 11.5–14.5)
WBC: 4.1 10*3/uL (ref 3.6–11.0)

## 2014-09-02 LAB — CK-MB
CK-MB: 4 ng/mL
CK-MB: 3.7 ng/mL

## 2014-09-02 LAB — CK TOTAL AND CKMB (NOT AT ARMC)
CK, Total: 71 U/L
CK-MB: 5.4 ng/mL — AB

## 2014-09-02 LAB — PROTIME-INR
INR: 3.1
PROTHROMBIN TIME: 31.8 s — AB

## 2014-09-02 LAB — TROPONIN I
Troponin-I: <0.03 ng/mL
Troponin-I: <0.03 ng/mL

## 2014-09-02 LAB — APTT: ACTIVATED PTT: 54.4 s — AB (ref 23.6–35.9)

## 2014-09-03 LAB — BASIC METABOLIC PANEL
ANION GAP: 9 (ref 7–16)
BUN: 31 mg/dL — ABNORMAL HIGH
CHLORIDE: 104 mmol/L
CO2: 33 mmol/L — AB
CREATININE: 1.55 mg/dL — AB
Calcium, Total: 8.8 mg/dL — ABNORMAL LOW
EGFR (Non-African Amer.): 30 — ABNORMAL LOW
GFR CALC AF AMER: 35 — AB
Glucose: 96 mg/dL
Potassium: 3.7 mmol/L
Sodium: 146 mmol/L — ABNORMAL HIGH

## 2014-09-06 ENCOUNTER — Emergency Department: Payer: Self-pay | Admitting: Emergency Medicine

## 2014-09-06 LAB — CBC WITH DIFFERENTIAL/PLATELET
BASOS ABS: 0 10*3/uL (ref 0.0–0.1)
Basophil %: 0.3 %
EOS ABS: 0 10*3/uL (ref 0.0–0.7)
EOS PCT: 0.8 %
HCT: 37.8 % (ref 35.0–47.0)
HGB: 12.5 g/dL (ref 12.0–16.0)
LYMPHS ABS: 0.6 10*3/uL — AB (ref 1.0–3.6)
Lymphocyte %: 12.8 %
MCH: 30.5 pg (ref 26.0–34.0)
MCHC: 33.1 g/dL (ref 32.0–36.0)
MCV: 92 fL (ref 80–100)
MONO ABS: 0.3 x10 3/mm (ref 0.2–0.9)
Monocyte %: 7.8 %
NEUTROS ABS: 3.4 10*3/uL (ref 1.4–6.5)
Neutrophil %: 78.3 %
PLATELETS: 185 10*3/uL (ref 150–440)
RBC: 4.11 10*6/uL (ref 3.80–5.20)
RDW: 16.3 % — AB (ref 11.5–14.5)
WBC: 4.3 10*3/uL (ref 3.6–11.0)

## 2014-09-06 LAB — BASIC METABOLIC PANEL
ANION GAP: 12 (ref 7–16)
BUN: 24 mg/dL — ABNORMAL HIGH
Calcium, Total: 10 mg/dL
Chloride: 102 mmol/L
Co2: 33 mmol/L — ABNORMAL HIGH
Creatinine: 1.29 mg/dL — ABNORMAL HIGH
EGFR (Non-African Amer.): 38 — ABNORMAL LOW
GFR CALC AF AMER: 44 — AB
Glucose: 157 mg/dL — ABNORMAL HIGH
Potassium: 3.6 mmol/L
Sodium: 147 mmol/L — ABNORMAL HIGH

## 2014-09-06 LAB — URINALYSIS, COMPLETE
BILIRUBIN, UR: NEGATIVE
Glucose,UR: NEGATIVE mg/dL (ref 0–75)
Hyaline Cast: 11
Ketone: NEGATIVE
Nitrite: POSITIVE
PH: 5 (ref 4.5–8.0)
Protein: 30
Specific Gravity: 1.012 (ref 1.003–1.030)
WBC UR: 101 /HPF (ref 0–5)

## 2014-09-06 LAB — TROPONIN I: Troponin-I: <0.03 ng/mL

## 2014-09-06 LAB — PRO B NATRIURETIC PEPTIDE: B-Type Natriuretic Peptide: 225 pg/mL — ABNORMAL HIGH

## 2014-09-07 ENCOUNTER — Observation Stay
Admit: 2014-09-07 | Disposition: A | Discharge status: Home / Self Care | Payer: Self-pay | Attending: Internal Medicine | Admitting: Internal Medicine

## 2014-09-07 LAB — CBC WITH DIFFERENTIAL/PLATELET
Basophil #: 0 10*3/uL (ref 0.0–0.1)
Basophil %: 0.3 %
Eosinophil #: 0 10*3/uL (ref 0.0–0.7)
Eosinophil %: 0.1 %
HCT: 34.1 % — ABNORMAL LOW (ref 35.0–47.0)
HGB: 10.4 g/dL — AB (ref 12.0–16.0)
LYMPHS PCT: 7.4 %
Lymphocyte #: 0.3 10*3/uL — ABNORMAL LOW (ref 1.0–3.6)
MCH: 30.3 pg (ref 26.0–34.0)
MCHC: 30.6 g/dL — AB (ref 32.0–36.0)
MCV: 99 fL (ref 80–100)
Monocyte #: 0.2 x10 3/mm (ref 0.2–0.9)
Monocyte %: 5.2 %
NEUTROS PCT: 87 %
Neutrophil #: 3.1 10*3/uL (ref 1.4–6.5)
Platelet: 135 10*3/uL — ABNORMAL LOW (ref 150–440)
RBC: 3.45 10*6/uL — ABNORMAL LOW (ref 3.80–5.20)
RDW: 16.8 % — ABNORMAL HIGH (ref 11.5–14.5)
WBC: 3.6 10*3/uL (ref 3.6–11.0)

## 2014-09-07 LAB — BASIC METABOLIC PANEL
ANION GAP: 11 (ref 7–16)
BUN: 22 mg/dL — ABNORMAL HIGH
CREATININE: 1.28 mg/dL — AB
Calcium, Total: 9.4 mg/dL
Chloride: 102 mmol/L
Co2: 33 mmol/L — ABNORMAL HIGH
EGFR (Non-African Amer.): 38 — ABNORMAL LOW
GFR CALC AF AMER: 44 — AB
Glucose: 139 mg/dL — ABNORMAL HIGH
Potassium: 3.6 mmol/L
SODIUM: 146 mmol/L — AB

## 2014-09-07 LAB — PROTIME-INR
INR: 3.5
Prothrombin Time: 35.1 secs — ABNORMAL HIGH

## 2014-09-08 LAB — PROTIME-INR
INR: 1.7
Prothrombin Time: 20.4 secs — ABNORMAL HIGH

## 2014-09-08 LAB — BASIC METABOLIC PANEL
Anion Gap: 6 — ABNORMAL LOW (ref 7–16)
BUN: 31 mg/dL — ABNORMAL HIGH
CO2: 36 mmol/L — AB
Calcium, Total: 8.9 mg/dL
Chloride: 99 mmol/L — ABNORMAL LOW
Creatinine: 1.83 mg/dL — ABNORMAL HIGH
EGFR (African American): 29 — ABNORMAL LOW
EGFR (Non-African Amer.): 25 — ABNORMAL LOW
Glucose: 92 mg/dL
POTASSIUM: 4 mmol/L
Sodium: 141 mmol/L

## 2014-09-08 LAB — CBC WITH DIFFERENTIAL/PLATELET
Basophil #: 0 10*3/uL (ref 0.0–0.1)
Basophil %: 0.3 %
Eosinophil #: 0.1 10*3/uL (ref 0.0–0.7)
Eosinophil %: 1 %
HCT: 33.7 % — ABNORMAL LOW (ref 35.0–47.0)
HGB: 11.2 g/dL — ABNORMAL LOW (ref 12.0–16.0)
Lymphocyte #: 0.9 10*3/uL — ABNORMAL LOW (ref 1.0–3.6)
Lymphocyte %: 18.1 %
MCH: 30.7 pg (ref 26.0–34.0)
MCHC: 33.3 g/dL (ref 32.0–36.0)
MCV: 92 fL (ref 80–100)
MONO ABS: 0.5 x10 3/mm (ref 0.2–0.9)
MONOS PCT: 10.3 %
Neutrophil #: 3.5 10*3/uL (ref 1.4–6.5)
Neutrophil %: 70.3 %
Platelet: 155 10*3/uL (ref 150–440)
RBC: 3.66 10*6/uL — AB (ref 3.80–5.20)
RDW: 15.7 % — ABNORMAL HIGH (ref 11.5–14.5)
WBC: 5 10*3/uL (ref 3.6–11.0)

## 2014-09-08 LAB — URINE CULTURE

## 2014-09-09 LAB — BASIC METABOLIC PANEL
ANION GAP: 7 (ref 7–16)
BUN: 24 mg/dL — ABNORMAL HIGH
CO2: 36 mmol/L — AB
Calcium, Total: 9.1 mg/dL
Chloride: 100 mmol/L — ABNORMAL LOW
Creatinine: 1.24 mg/dL — ABNORMAL HIGH
GFR CALC AF AMER: 46 — AB
GFR CALC NON AF AMER: 40 — AB
Glucose: 101 mg/dL — ABNORMAL HIGH
Potassium: 3.9 mmol/L
Sodium: 143 mmol/L

## 2014-09-09 LAB — PROTIME-INR
INR: 1.6
Prothrombin Time: 19.1 secs — ABNORMAL HIGH

## 2014-09-10 LAB — PROTIME-INR
INR: 1.5
Prothrombin Time: 18.3 secs — ABNORMAL HIGH

## 2014-09-12 LAB — CULTURE, BLOOD (SINGLE)

## 2014-09-24 ENCOUNTER — Ambulatory Visit
Admit: 2014-09-24 | Disposition: A | Discharge status: Home / Self Care | Payer: Self-pay | Attending: Family Medicine | Admitting: Family Medicine

## 2014-10-02 NOTE — H&P (Signed)
PATIENT NAME:  Stefanie, Bryan MR#:  [id number] DATE OF BIRTH:  10-Aug-1927  DATE OF ADMISSION:  02/18/2014  PRIMARY CARE PHYSICIAN:  Hillery Aldo, MD  EMERGENCY ROOM PHYSICIAN:  Cecille Amsterdam. Mayford Knife, MD   CARDIOLOGIST:  Bobbie Stack. Callwood, MD   CHIEF COMPLAINT: Shortness of breath, palpitations.   HISTORY OF PRESENT ILLNESS: An 81 year old female with history of chronic atrial fibrillation, comes in because of shortness of breath started this morning. She also noted heaviness in the chest.  Heaviness in the chest started this morning as well. The patient noted to have a heartbeat that was very fast and she called EMS. Heart rate was in the 150s and blood pressure was 227/128 when EMS arrived.  The patient was here yesterday for numbness of the face and she was discharged from the Emergency Room, and her clonidine was changed to a clonidine patch.  The patient's EKG was normal yesterday and heart rate was also normal yesterday in the Emergency Room. Today, when she came here, she was having atrial fibrillation with rapid ventricular response.  She received her regular dose of atenolol at 100 mg and she also received a dose of Cardizem 120 mg p.o. stat and she also received IV Cardizem 20 mg x1.  Because heart rate was persistently high at 120s, she was started on Cardizem drip. Right now, she is on 5 mg per hour of Cardizem drip. Heart rate is in the 90s. The patient's chest pain resolved, trouble breathing resolved, and she feels much better. The patient denies any other complaints.   PAST MEDICAL HISTORY: Is significant for a history of hypertension, chronic atrial fibrillation, history of gout.    She was admitted in March, from March 7th to March 8th.  At that time, she had atrial fibrillation and also pneumonia. The patient has other diagnoses significant for hypertension.   PAST SURGICAL HISTORY: Significant for cholecystectomy, hysterectomy, bilateral knee surgeries,   ALLERGIES: TO SULFA  AND TAPE.  SOCIAL HISTORY:  No smoking. No drinking.   FAMILY HISTORY: Significant for coronary artery disease and stroke.   MEDICATIONS: Allopurinol 100 mg p.o. daily, aspirin 325 mg p.o. daily, atenolol 100 mg p.o. daily, Cardizem CD 120 mg p.o. daily, clonidine 0.2 mg ( daily, gemfibrozil 600  mg p.o. b.i.d., hydralazine hydrochloride 25 mg p.o. daily, nitroglycerin 0.4 mg sublingual every 5 minutes, Zofran 4 mg every 4 hours as needed, ProAir 2 puffs every 4 to 6 hours as needed for wheezing, quinapril 40 mg p.o. daily, vitamin B12, 1000 mcg p.o. daily.   REVIEW OF SYSTEMS:   CONSTITUTIONAL: Had no fever, no fatigue.  ENT: No tinnitus. No ear pain. No hearing loss.  HENT:  No difficulty swallowing.  RESPIRATORY: The patient denies any cough or wheezing.  CARDIOVASCULAR: Heaviness in the chest this morning, denies any orthopnea or PND. No  lower extremity edema. The patient also has some palpitations. Denies any syncope.  GASTROINTESTINAL: No nausea. No vomiting. No abdominal pain.  GENITOURINARY: No dysuria or hematuria.  ENDOCRINE: No polyuria, polydipsia.  HEMATOLOGIC: No anemia or easy bruising.  INTEGUMENTARY: No skin rash.  MUSCULOSKELETAL: No joint pain.  NEUROLOGIC: No numbness or weakness.  PSYCHIATRIC: No anxiety or insomnia.   PHYSICAL EXAMINATION: VITAL SIGNS: Temperature 98.4, heart rate 157, blood pressure is 227/120. The patient's saturation 98% on room.  GENERAL:  Alert, awake, oriented, elderly female not in distress.  Answers questions appropriately.   HEAD AND EYES:  Atraumatic, normocephalic.  Pupils equal and reactive to  light.  Extraocular movements intact.   ENT: No tympanic membrane congestion. No turbinate hypertrophy. No  oropharyngeal erythema.  NECK:  Supple.  No JVD. No carotid bruits.  RESPIRATORY: Good respiratory effort. Clear to auscultation. No wheeze. No rales.  CARDIOVASCULAR: S1, S2, regularly irregular.  Rate is around 90 beats per minute. The  patient has good pulses. No extremity edema.  ABDOMEN: Soft, nontender, nondistended. Bowel sounds present.  EXTREMITIES: No extremity edema. No cyanosis, no clubbing.  NEUROLOGIC: Alert, awake, oriented.  Cranial nerves II through XII intact.   Power 5/5 upper and lower extremities. Sensation is intact. DTRs 2+ bilaterally.  PSYCHIATRIC: Mood and affect are within normal limits.   LABORATORY DATA AND DIAGNOSTIC DATA:  Chest x-ray shows bibasilar atelectasis or scarring. Troponin is less than 0.02. CK 80, CPK/MB 3, INR 1.  WBC 4.7, hemoglobin 15.3, hematocrit 47.2, platelets 161.  Electrolytes: Sodium 142, potassium 3.5, chloride 105, bicarbonate 28, BUN 11, creatinine 1.71. Glucose is 109 and lactase is within normal limits.   EKG: Atrial fibrillation with rapid ventricular response 155 beats per minute initially.   ASSESSMENT AND PLAN: 1.  The patient is an 81 year old female patient with atrial fibrillation with rapid ventricular response . Admit her to Critical Care Unit.  Start her on Cardizem drip.  The patient received a dose of Cardizem and also atenolol in the Emergency Room, but heart rate is still high, so will be started on Cardizem drip. Heart rate improved with the drip.  She is on 5 mg of Cardizem per hour.  Heart rate is the 90s. Continue the Cardizem drip and obtain cardiology consult.  2.  Chest heaviness likely secondary to malignant hypertension and atrial fibrillation with rapid ventricular response.  Cycle the troponins along with the CK.  Continue her on aspirin. Continue her nitroglycerin and cycle the troponins and see.  3.  Malignant hypertension and atrial fibrillation with rapid ventricular response.  She is on atenolol, Cardizem, and clonidine. We have restarted all the medications, and monitor her  blood pressure is in the Intensive Care Unit.  4.  Facial numbness, likely secondary to B12 deficiency. She had a CT head yesterday, which was normal; her numbness is also.  Continue B12 supplements.  5.  History of gout. Continue allopurinol.  6.  Regarding atrial fibrillation, she has been thrombocytopenic before, so no anticoagulation was given when she was here in March. Right now, her platelets are better.  I am starting on aspirin 320 mg daily and obtaining cardiology consult to discuss the full anticoagulation.   TIME TAKEN:  Is about 60 minutes.    ____________________________ Katha HammingSnehalatha Latarra Eagleton, MD sk:lr D: 02/18/2014 12:00:25 ET T: 02/18/2014 12:28:41 ET JOB#: [id number]  cc: Katha HammingSnehalatha Everley Evora, MD, <Dictator> Katha HammingSNEHALATHA Parish Dubose MD ELECTRONICALLY SIGNED 03/10/2014 14:52

## 2014-10-02 NOTE — Discharge Summary (Signed)
PATIENT NAME:  Jilda RocheHUTCHERSON, Tamalyn MR#:  [id number] DATE OF BIRTH:  1927-12-29  DATE OF ADMISSION:  02/18/2014 DATE OF DISCHARGE:  02/22/2014  For a detailed note please look at the history and physical done on admission by Dr. Luberta MutterKonidena.   DIAGNOSES AT DISCHARGE:  1. Atrial fibrillation with rapid ventricular response.  2. Hypertension.  3. Suspected urinary tract infection.  4. Malignant hypertension.  5. Hyperlipidemia.   DISCHARGE INSTRUCTIONS:  The patient is being discharged on a low-sodium, low-fat diet. Activity is as tolerated. Follow up with Dr. Juliann Paresallwood in the next 1-2 weeks. Also follow up with Dr. Hillery AldoSarah Patel in next 1-2 weeks.   DISCHARGE MEDICATIONS: Hydroxyzine 25 mg at bedtime as needed, vitamin B12 1000 mcg daily, allopurinol 100 mg daily, calcium with vitamin D 1 tab b.i.d., gemfibrozil 600 mg  b.i.d., albuterol inhaler 2 puffs 4-6 hours as needed, sublingual nitroglycerin 1 tab q. 5 minutes as needed. Zofran 4 mg q. 4 hours as needed, dicyclomine 10 mg q. 6 hours as needed, clonidine 0.2 mg b.i.d., Cardizem CD 180 mg daily, quinapril 20 milligrams daily, amiodarone 400 b.i.d. x 7 days, then to start amiodarone 200 mg b.i.d., and Xarelto 15 mg daily.   CONSULTANTS DURING THE HOSPITAL COURSE: Dr. Mariel KanskyKen Fath from cardiology.   PERTINENT STUDIES DONE DURING THE HOSPITAL COURSE: A CT scan of the head done without contrast showing diffuse cerebral atrophy and white matter changes consistent with chronic ischemia, no acute abnormality. A chest x-ray done on admission showing stable bibasilar atelectasis and scarring, no acute cardiopulmonary disease. A 2-dimensional echocardiogram showing ejection fraction of 70-75%, moderate LVH, mildly dilated left atrium, mild mitral valve regurgitation, mild aortic regurgitation.   HOSPITAL COURSE: This is an 81 year old female with medical problems as mentioned above, who presented to the hospital 02/18/2014 due to shortness of breath and  palpitations and noted to be in malignant hypertension, in rapid atrial fibrillation.   1. Atrial fibrillation with rapid ventricular response. This was likely the cause of the patient's shortness of breath and palpitations. The patient was admitted to the hospital and started on a Cardizem drip. She was maintained on a Cardizem drip for about 24 hours, weaned off of it and put on oral Cardizem. She was subsequently transferred out of the ICU. Although shortly after being transferred out of ICU the patient went into rapid atrial fibrillation again, was given pulse doses of IV Cardizem and digoxin and she tolerated it well and converted to a sinus rhythm. She was started on an oral loading dose of amiodarone and is currently being discharged on that. Her amiodarone dose should be changed from 400 b.i.d. to 200 b.i.d. within a week. She had an echocardiogram done which showed normal ejection fraction. Her CHADS score was greater than 4, therefore she was high risk for CVA and therefore was started Xarelto. At this point the patient in the past 24-48 hours has remained in a normal sinus rhythm, is currently being discharged on amiodarone and Cardizem as mentioned for rate control for atrial fibrillation.  2. Malignant hypertension. The patient's systolic blood pressures were over 200s when she presented to the hospital. She was admitted to the intensive care unit as mentioned, was started on labetalol drip. She has been weaned off of that now. Her blood pressures have been labile although have improved. Presently she is being discharged on oral Cardizem, clonidine, a lower dose of quinapril. Further titrations to antihypertensives medications can be made as an outpatient.  3. Nausea  and vomiting. This was actually related to hypertensive encephalopathy. The patient was given antiemetics and has since then improved and resolved now.  4. Urinary tract infection. This was suspected on admission based on an abnormal  urinalysis, her  urine cultures have remained negative. She has received a total of 5 days of IV antibiotics and has finished treatment.  5. Elevated troponin. This was likely in the setting of demand ischemia from the atrial fibrillation with RVR. She is currently chest pain-free and hemodynamically stable. Her echocardiogram showed normal EF with no wall motion abnormalities.  6. Hyperlipidemia. The patient was maintained on her gemfibrozil, she will resume that.  7. History of gout. The patient had no acute gout attacks, she will continue her allopurinol.   The patient is a full code.   DISPOSITION: She is being discharged home.   TIME SPENT: 40 minutes.    ____________________________ Rolly Pancake. Cherlynn Kaiser, MD vjs:bu D: 02/22/2014 15:34:58 ET T: 02/22/2014 16:04:29 ET JOB#: [id number]  cc: Rolly Pancake. Cherlynn Kaiser, MD, <Dictator> Dwayne D. Juliann Pares, MD Sarah "Maryruth Hancock, MD  Houston Siren MD ELECTRONICALLY SIGNED 03/01/2014 9:42

## 2014-10-02 NOTE — H&P (Signed)
PATIENT NAME:  Stefanie Bryan, Stefanie Bryan MR#:  [id number] DATE OF BIRTH:  01-29-1928  DATE OF ADMISSION:  08/15/2013  REFERRING PHYSICIAN: Dr. Mindi Junker.   FAMILY PHYSICIAN: Dr. Hillery Aldo.   REASON FOR ADMISSION: New atrial fibrillation.   HISTORY OF PRESENT ILLNESS: The patient is an 81 year old female with a history of hypertriglyceridemia and pancreatitis. Also has a history of hypertension and gout. Has a remote history of "angina", but underwent cardiac catheterization, which was normal. She presents to the Emergency Room with shortness of breath and cough. She has been treated as an outpatient by her primary physician for presumed pneumonia. She has been on antibiotics with no improvement. She presented to the Emergency Room for worsening shortness of breath and cough where she was noted to be in mild pulmonary edema and new atrial fibrillation. She denies chest pain. She is now admitted for further evaluation.   PAST MEDICAL HISTORY:  1.  Benign hypertension.  2.  Gout.  3.  History of pancreatitis.  4.  Hypertriglyceridemia.  5.  Status post cholecystectomy.  6.  Status post hysterectomy.  7.  Status post bilateral knee surgery.   MEDICATIONS:  1.  B12 1000 mcg p.o. daily.  2.  Quinapril 40 mg p.o. daily.  3.  Protonix 40 mg p.o. daily.  4.  Lopid 600 mg p.o. b.i.d.  5.  Atenolol 100 mg p.o. daily.  6.  Allopurinol 100 mg p.o. daily.  7.  Atarax 25 mg p.o. at bedtime.  8.  Bentyl 10 mg p.o. q.6 hours p.r.n. abdominal pain.  9.  Clonidine 0.2 mg p.o. b.i.d.  10. Norvasc 10 mg p.o. daily.  11. Ativan 1 mg p.o. at bedtime.   ALLERGIES: SULFA AND TAPE.   SOCIAL HISTORY: Negative for alcohol or tobacco abuse.   FAMILY HISTORY: Positive for coronary artery disease and stroke.   REVIEW OF SYSTEMS: CONSTITUTIONAL: The patient had fever 3 days ago, but none recently. No change in weight. EYES: No blurred or double vision. No glaucoma. ENT: No tinnitus or hearing loss. No nasal  discharge or bleeding. No difficulty swallowing. RESPIRATORY: The patient has had cough. No wheezing. No hemoptysis. No painful respiration. CARDIOVASCULAR: No chest pain or orthopnea. No palpitations.  GASTROINTESTINAL: No nausea, vomiting, or diarrhea. No abdominal pain. No change in bowel habits. GENITOURINARY: No dysuria or hematuria. No incontinence. ENDOCRINE: No polyuria or polydipsia. No heat or cold intolerance. HEMATOLOGIC: The patient denies anemia, easy bruising, or bleeding. LYMPHATIC: No swollen glands. MUSCULOSKELETAL: The patient has pain in her neck, low back, shoulders, knees, or hips. No gout. NEUROLOGIC: No numbness or migraines. Denies stroke or seizures. PSYCHIATRIC: The patient denies anxiety, insomnia or depression.   PHYSICAL EXAMINATION:  GENERAL: The patient is in no acute distress.  VITAL SIGNS: Currently remarkable for a blood pressure of 104/49, heart rate of 91, which is irregular, respiratory rate of 18, temperature of 97.8, satting 94% on room air.  HEENT: Normocephalic, atraumatic. Pupils equally round and reactive to light and accommodation. Extraocular movements are intact. Sclerae are anicteric. Conjunctivae are clear.  Oropharynx is clear.  NECK: Supple without JVD. No adenopathy or thyromegaly is noted.  LUNGS: Reveal basilar crackles without wheezes or rhonchi. No dullness. Respiratory effort is normal.  CARDIAC: Irregularly irregular rhythm. No significant rubs, murmurs or gallops. PMI is nondisplaced. Chest wall is nontender.  ABDOMEN: Soft, nontender, with normoactive bowel sounds. No organomegaly or masses were appreciated. No hernias or bruits were noted.  EXTREMITIES: Without clubbing, cyanosis or edema.  Pulses were 2+ bilaterally.  SKIN: Warm and dry without rash or lesions.  NEUROLOGIC: Cranial nerves II through XII grossly intact. Deep tendon reflexes were symmetric. Motor and sensory examination is nonfocal.  PSYCHIATRIC: Revealed a patient who was  alert and oriented to person, place, and time. She was cooperative and used good judgment.   LABORATORY DATA: EKG revealed atrial fibrillation at 91 beats per minute with no acute ischemic changes. Chest x-ray revealed bibasilar densities consistent with mild pulmonary edema versus atelectasis. White count was 3.2 with a hemoglobin of 12.5. Glucose 99 with a BUN of 24, creatinine 1.63 with a sodium of 140, potassium 3.3.  BNP of 5911 and a troponin of less than 0.02.   ASSESSMENT:  1.  New atrial fibrillation.  2.  Acute systolic congestive heart failure.  3.  Stage III chronic kidney disease.  4.  Hypokalemia.  5.  Benign hypertension.  6.  Gout.  7.  Hypertriglyceridemia.   PLAN: The patient will be admitted to telemetry on aspirin and Lovenox. We will replace her Norvasc with diltiazem. Continue beta blocker therapy. We will supplement potassium at this time. We will check a TSH. Follow serial cardiac enzymes and obtain an echocardiogram. We will consult cardiology in regards to her new atrial fibrillation. We will optimize her pulmonary regimen. Follow up routine labs and a chest x-ray in the morning. Supplement oxygen if needed. 2 gram sodium diet. Further treatment and evaluation will depend upon the patient's progress.   TOTAL TIME SPENT ON THIS PATIENT: 50 minutes.   ____________________________ Duane LopeJeffrey D. Judithann SheenSparks, MD jds:aw D: 08/15/2013 14:22:54 ET T: 08/15/2013 14:30:02 ET JOB#: [id number]  cc: Duane LopeJeffrey D. Judithann SheenSparks, MD, <Dictator> Sarah "Maryruth HancockSallie" Patel, MD Cheney Ewart Rodena Medin Anyah Swallow MD ELECTRONICALLY SIGNED 08/15/2013 15:32

## 2014-10-02 NOTE — Discharge Summary (Signed)
PATIENT NAME:  Stefanie Bryan, Stefanie Bryan MR#:  [id number] DATE OF BIRTH:  1927-09-30  DATE OF ADMISSION:  08/15/2013  DATE OF DISCHARGE:  08/16/2013  ADMISSION DIAGNOSES:  1.  New onset atrial fibrillation.  2.  Acute congestive heart failure.  3.  Hypertension.   DISCHARGE DIAGNOSES: 1.  New onset atrial fibrillation.  2.  Acute congestive heart failure, diastolic.  3.  Pneumonia.  4.  Hypertension.  5.  Thrombocytopenia.  CONSULTATIONS:  Dr. Darrold JunkerParaschos.   PERTINENT LABORATORIES AT DISCHARGE: White blood cells 2.2, hemoglobin 11.9, hematocrit 36, platelets 89.   ECHO nml EF with moderate LVH and mod MR   HOSPITAL COURSE:  This is an 81 year old female who was being treated for pneumonia as an outpatient, presented with shortness of breath. Found to have acute on chronic infiltrates on her chest x-ray, with an elevated BNP and new onset atrial fibrillation. For further details, please refer to the H and P.  1.  New onset atrial fibrillation. The patient is now converted to normal sinus rhythm. I suspect this is secondary to her pneumonia. TSH was normal. Her platelets are low, and therefore patient would not be a candidate for long-term anticoagulation due to bleeding risk, and the patient actually did not want to start anticoagulation. Dr. Darrold JunkerParaschos was consulted, and is in agreement of this plan as well.   2.  Pneumonia. The patient has been treated as an outpatient. Will continue with the antibiotics.   3.  Acute heart failure, diastolic. She received one dose of Lasix in the ER, none since then.She diuresed with this. No additional LASIX was given  4.  Hypertension. The patient will continue her current medications. She is now on diltiazem due to problem #1.  DISCHARGE MEDICATIONS: 1.  Hydroxyzine hydrochloride 25 mg as needed at bedtime.  2.  Vitamin B12, 1000 mcg daily.  3.  Allopurinol 100 mg daily. 4.  Atenolol 100 mg daily.  5.  Clonidine 0.2 mg 2 tablets b.i.d.  6.   Lorazepam 1 mg at bedtime p.r.n.  7.  Calcium carbonate 600/400 b.i.d.  8.  Omeprazole 20 mg daily.  9.  Quinapril 40 mg daily. 10.  Gemfibrozil 600 mg b.i.d.  11.  ProAir 2 puffs q. 4 to 6 hours p.r.n.  12.  Levaquin 500 mg daily for 10 days.  13.  Nitroglycerin sublingual p.r.n. chest pain.  14.  Aspirin 325 mg daily.  15.  Cardizem 120 mg daily.   DISCHARGE DIET: Low sodium.   DISCHARGE ACTIVITY: As tolerated.   DISCHARGE FOLLOWUP:  The patient will follow up in one week with Dr. Darrold JunkerParaschos, in one week with Dr. Allena KatzPatel.   The patient is medically stable for discharge.    ____________________________ Caili Escalera P. Juliene PinaMody, MD spm:mr D: 08/16/2013 12:08:59 ET T: 08/16/2013 20:19:52 ET JOB#: [id number]  cc: Danarius Mcconathy P. Juliene PinaMody, MD, <Dictator> Sarah "Sallie" Allena KatzPatel, MD Marcina MillardAlexander Paraschos, MD   Janyth ContesSITAL P Tanasha Menees MD ELECTRONICALLY SIGNED 08/17/2013 14:19

## 2014-10-02 NOTE — Consult Note (Signed)
   Present Illness 81 yo female with history of afib in the past who was admitted after presenting with rapid heart rate. She was noted to have afrib with rvr.  She was placed on cardizem drip and has converted to nsr. She has ruled out for an mi. She states that she was considered for chronic anticoagulation in the past but deferred due to thrombocytopenia. Platelets are 160k today. SHe is currently stable. at present.   Physical Exam:  GEN well nourished, no acute distress   HEENT PERRL, hearing intact to voice   NECK supple  No masses   RESP normal resp effort  clear BS   CARD Regular rate and rhythm  Murmur   Murmur Systolic   Systolic Murmur axilla   ABD denies tenderness  normal BS   LYMPH negative neck   EXTR negative cyanosis/clubbing, negative edema   SKIN normal to palpation   NEURO cranial nerves intact, motor/sensory function intact   PSYCH Bryan+O to time, place, person   Review of Systems:  Subjective/Chief Complaint afib   General: Fatigue   Skin: No Complaints   ENT: No Complaints   Eyes: No Complaints   Respiratory: No Complaints   Cardiovascular: Palpitations   Gastrointestinal: No Complaints   Genitourinary: No Complaints   Vascular: No Complaints   Musculoskeletal: No Complaints   Neurologic: No Complaints   Hematologic: No Complaints   Endocrine: No Complaints   Psychiatric: No Complaints   Review of Systems: All other systems were reviewed and found to be negative   Medications/Allergies Reviewed Medications/Allergies reviewed   Family & Social History:  Family and Social History:  Family History Non-Contributory   EKG:  Interpretation admission ekg afib with rvr now in nsr    Septra: Unknown  Adhesive: Unknown   Impression 81 yo female with history of intermitant afib now readmitted with recurrent afib. Converted to nsr on iv cardiazem. CHADS score of 2. Has ruled out for mi. No absolute contraindication for  anticoagulation. Risk and benefits discussed with pt and family> Will wean off of iv cardizem to po. Transfer to telemetry. Add xarelto 20 mg daily   Plan 1. Disconitnue iv cardizem 2. Cardizem 30 q 6 3. Continue with atenolol for now at 100 mg and with ace i and clonidine and follow heart rate 4. Xarelto 20 daily 5. Transfer to telemetry   Electronic Signatures: Stefanie Bryan, Stefanie Bryan (MD)  (Signed 10-Sep-15 16:51)  Authored: General Aspect/Present Illness, History and Physical Exam, Review of System, Family & Social History, EKG , Allergies, Impression/Plan   Last Updated: 10-Sep-15 16:51 by Stefanie Bryan, Stefanie Bryan (MD)

## 2014-10-10 NOTE — Consult Note (Signed)
   Present Illness Patient is an 81 year old female with a history of Hypertension, coronary artery disease, A-fib, COPD, and pulmonary fibrosis.  she was admitted after presenting to the emergency room with chest discomfort.  She is ruled out for a myocardial infarction.  Electrocardiogram reveals sinus rhythm with nonspecific ST T wave changes.  Patient is on amiodarone and warfarin for her atrial fibrillation.  She described pain under left breast radiating across her chest.  She had a cardiac catheterization in October, 2015 revealing no significant disease.  She has a history of hypertension which is treated with clonidine, diltiazem, quinapril.  She states she has been compliant with her medications.  She has not started or stopped any medications recently she feels somewhat better since admission.  Her daughter states that the patient complains of some heaviness in her chest.  Her rhythm appears well controlled.   Physical Exam:  GEN no acute distress   HEENT PERRL   NECK No masses   RESP normal resp effort   CARD Irregular rate and rhythm  Murmur   Murmur Systolic   Systolic Murmur Out flow  2 to 3/6   ABD denies tenderness  normal BS   LYMPH negative neck, negative axillae   EXTR negative cyanosis/clubbing, negative edema   SKIN normal to palpation   NEURO cranial nerves intact, motor/sensory function intact   PSYCH A+O to time, place, person   Review of Systems:  Subjective/Chief Complaint chest tightness and chest pain across her anterior chest wall   General: Weakness   Skin: No Complaints   ENT: No Complaints   Eyes: No Complaints   Neck: No Complaints   Respiratory: No Complaints   Cardiovascular: Chest pain or discomfort  Tightness   Gastrointestinal: No Complaints   Genitourinary: No Complaints   Vascular: No Complaints   Musculoskeletal: No Complaints   Neurologic: No Complaints   Hematologic: No Complaints   Endocrine: No Complaints    Psychiatric: No Complaints   Review of Systems: All other systems were reviewed and found to be negative   Medications/Allergies Reviewed Medications/Allergies reviewed   Family & Social History:  Family and Social History:  Family History Non-Contributory   EKG:  Interpretation atrial fibrillation with variable ventricular response    Septra: Unknown  Adhesive: Unknown   Impression patient's history of atrial fibrillation treated with rate control with amiodarone and diltiazem and anticoagulated with warfarin, history of atypical chest pain with cardiac catheterization less than 1 year ago showing no significant disease who was admitted with chest pain.  She is ruled out from myocardia infarction.  Her symptoms did not appear to be ischemic.  Her heart rate appears to be in good control.  Would continue to follow over the next 12-18 hours to determine if there is any further arrhythmias or other etiology or for pain.  If she is stable on the morning would consider discharge on current medical regimen   Plan 1. Continue with amiodarone and warfarin as well as diltiazem for rate control with an INR goal between 2 and 3 2. Continue with diltiazem, quinapril, amlodipine for blood pressure control 3.  continue follow her heart rate on telemetry 4. Ambulate   Electronic Signatures: Dalia HeadingFath, Phyllistine Domingos A (MD)  (Signed 24-Mar-16 16:56)  Authored: General Aspect/Present Illness, History and Physical Exam, Review of System, Family & Social History, EKG , Allergies, Impression/Plan   Last Updated: 24-Mar-16 16:56 by Dalia HeadingFath, Ernesteen Mihalic A (MD)

## 2014-10-10 NOTE — Discharge Summary (Signed)
PATIENT NAME:  Stefanie RocheHUTCHERSON, Laqueisha MR#:  [id number] DATE OF BIRTH:  Aug 01, 1927  DATE OF ADMISSION:  09/07/2014 DATE OF DISCHARGE:  09/10/2014  DISCHARGE DIAGNOSES:   1. Nausea and vomiting likely due to gastritis.  2. Extended spectrum beta-lactamase urinary tract infection.  3. Pneumonia.  4. Coronary artery disease.  5. Hypertension.   CONDITION ON DISCHARGE: Stable.   MEDICATIONS ON DISCHARGE: 1. Allopurinol 100 mg oral tablet once a day.  2. Hydroxyzine hydrochloride 10 mg oral tablet once a day.  3. Metoprolol 25 mg once a day.  4. Cardizem 120 mg extended release once a day.  5. MiraLax oral powder 17 once a day.  6. Clobetasone topical cream affected area 2 times a day as needed for rash.  7. Albuterol 2 puffs inhalation every 6 hours as needed for wheezing.  8. Lopid 600 mg oral tablet 2 times a day.  9. Ondansetron 4 mg oral tablet every 8 as needed for nausea and vomiting.  10. Pantoprazole 40 mg delayed release tablet once a day.  11. Furosemide 20 mg oral tablet once a day.  12. Dicyclomine 10 mg oral capsule every 6 hours as needed for abdominal pain.  13. Amiodarone 200 mg oral once a day in the evening.  14. Amlodipine 5 mg oral tablet 2 times a day.  15. Calcium plus vitamin D 600 mg 2 times a day.  16. Cyanocobalamin 1000 mcg oral once a day.  17. Warfarin 5 mg oral, take 1/2 tablet at sleep.  18. Ondansetron 4 mg oral 3 times a day for 10 days.  19. Nitrofurantoin 100 mg oral capsule 4 times a day for 10 days.  20. Azithromycin 250 mg oral tablet once a day for 4 days  Advised to have physical therapy and occupational therapy on discharge and 3 liters nasal cannula oxygen. Low sodium diet and check INR with PMD in 1 week.    HISTORY OF PRESENT ILLNESS: By Dr. Angelica Ranavid Hower:  An 81 year old Caucasian female with past medical history of gastroesophageal reflux disease without esophagitis, chronic pancreatitis, came with nausea and vomiting for 1 day. She presented  to the Emergency Room with chills for 1 day and diagnosed with UTI and discharge prescription of Levaquin was given. After she went home, she developed nausea and vomiting which was nonbloody, nonbilious, but was not able to tolerate oral, so came back to Emergency Room after 5 to 6 episodes of vomiting and so admitted to hospitalist team for further management.   HOSPITAL COURSE AND STAY:  1. For vomiting episode, she did not have any abdominal pain, fever.   Abdominal x-ray was normal.  Most likely it was a gastritis episode.   After symptomatic management, she was able to tolerate her diet, so we gave a prescription of Zofran on discharge.  2. Acute renal failure over chronic kidney disease.  This improved with IV fluid and we stopped IV fluid after she improved the prevent her going in congestive heart failure.  3. UTI with Escherichia coli ESBL. We changed antibiotic to Invanz and on discharge gave her nitrofurantoin depending on her urine culture.  4. Pneumonia was on the left side. She was on Invanz and has added azithromycin.   Azithromycin was given on discharge.  5. Hypertension. She was on multiple medications at home. Blood pressure was on low normal side, so we held it initially but later on clonidine, quinapril, and amlodipine has to be resumed.   Advised to follow with PMD.  6. Coronary artery disease. The patient has a recent catheterization in 03/2014 which showed mild disease. 7. Atrial fibrillation.  Continued warfarin and amiodarone.   Adjusted, so advised to follow her INR level within a week in PMDs office. 8. Chronic obstructive pulmonary disease and fibrosis. She was on chronic home oxygen as needed. There was no acute wheezing in the hospital.   IMPORTANT LABORATORY RESULTS IN THE HOSPITAL:  1. Blood culture on admission was negative. WBC count 3.6, hemoglobin 10.4, platelet count is 135, and MCV 99.  2. INR is 3.5.  3. Creatinine 1.28, BUN 22, sodium 146, potassium 3.6,  chloride 102, and CO2 of 33.  4. INR was 1.7 on presentation. Creatinine went up to 1.83 on March 30 but gradually came down to 1.24 on March 31.  5. Abdominal x-ray showed prior cholecystectomy; no obstruction or free air.    TOTAL TIME SPENT ON THIS DISCHARGE: Forty minutes.     ____________________________ Hope Pigeon Elisabeth Pigeon, MD vgv:tr D: 09/14/2014 16:04:00 ET T: 09/14/2014 16:33:45 ET JOB#: [id number]  cc: Hope Pigeon. Elisabeth Pigeon, MD, <Dictator> Sarah "Sallie" Allena Katz, MD Altamese Dilling MD ELECTRONICALLY SIGNED 09/16/2014 0:38

## 2014-10-10 NOTE — Discharge Summary (Signed)
PATIENT NAME:  Stefanie Bryan, [name]:  [id number] DATE OF BIRTH:  01-Nov-1927  DATE OF ADMISSION:  09/02/2014 DATE OF DISCHARGE:  09/03/2014  PRESENTING COMPLAINT: Chest tightness, shortness of breath.   DISCHARGE DIAGNOSES: 1. Acute mild diastolic congestive heart failure.  2. Chest pain, resolved.  3. Chronic obstructive pulmonary disease with pulmonary fibrosis on chronic home oxygen.  4. Code status: Full code.   MEDICATIONS: 1. Allopurinol 100 mg p.o. daily.  2. Hydroxyzine hydrochloride 10 mg p.o. daily at bedtime.  3. Metoprolol 25 mg extended release p.o. daily.  4. Diltiazem extended release 120 mg p.o. daily.  5. MiraLax 17 grams p.o. daily.  6. Clobetasol topical 0.05% topical area b.i.d.  7. Albuterol 2 puffs every 6 hours as needed.  8. Lopid 600 mg b.i.d.  9. Amiodarone 200 mg p.o. daily.  10. Amlodipine 5 mg daily.  11. Calcium carbonate with vitamin D 1 tablet b.i.d.  12. Zofran 4 mg p.o. every 8 hourly.  13. Protonix 40 mg p.o. daily.  14. Quinapril 20 mg p.o. b.i.d.  15. Warfarin 5 mg, 2.5 mg p.o. daily at bedtime.  16. Clonidine 0.2 mg 2 tablets b.i.d.  17. Lasix 20 mg daily.  18. Dicyclomine 10 mg every 6 hours.   DISCHARGE INSTRUCTIONS:  1. Home health RN.  2. Continue home oxygen 3 liters nasal cannula.   FOLLOWUP:  1. Dr. Hillery AldoSarah Astraea Gaughran in 2 to 4 weeks.  2. Dr. Lady GaryFath in 2 to 4 weeks.   CONSULTATIONS: Cardiology consultation by Dr. Lady GaryFath.   LABORATORIES: Creatinine 1.55, BUN 31, sodium is 146. Cardiac enzymes x 3 negative. CBC within normal limits.   HISTORY OF PRESENT ILLNESS: Ms Stefanie Bryan is a very pleasant, 81 year old, Caucasian female with multiple medical problems, came in with:  1. Chest pain. Has known CAD. EKG showed only Afib. There were no signs of ischemia. Cardiology consultation was done with Dr. Lady GaryFath. The patient was ruled out for MI. Her symptoms improved more so after receiving IV Lasix and breathing with chest pressure improved, likely  had pulmonary edema, mild with diastolic congestive heart failure, acute. EF in the past was 70%. The patient was continued on her home Lasix. She was also recommended to continue wearing her oxygen as instructed.  2. Coronary artery disease. Recent cardiac catheterization in October of 2015 showed mild disease.  3. Atrial fibrillation. Continued warfarin and amiodarone.  4. Hyperlipidemia on gemfibrozil.  5. Chronic obstructive pulmonary disease with pulmonary fibrosis. The patient is on chronic home oxygen.  6. Deep vein thrombosis prophylaxis on warfarin.   Home health has been arranged. Overall hospital stay otherwise remained stable.   CODE STATUS: The patient remained a full code.   TIME SPENT: 40 minutes.    ____________________________ Wylie HailSona A. Allena KatzPatel, MD sap:TT D: 09/04/2014 06:44:28 ET T: 09/04/2014 13:16:58 ET JOB#: [id number]  cc: Hurley Blevins A. Allena KatzPatel, MD, <Dictator> Sarah "Sallie" Allena KatzPatel, MD Darlin PriestlyKenneth A. Lady GaryFath, MD Willow OraSONA A Lysander Calixte MD ELECTRONICALLY SIGNED 09/13/2014 12:24

## 2014-10-10 NOTE — H&P (Signed)
PATIENT NAME:  Stefanie Bryan, Stefanie Bryan MR#:  [id number] DATE OF BIRTH:  12-27-1927  DATE OF ADMISSION:  09/02/2014  REFERRING PHYSICIAN: Marjean Donna, M.D.   PRIMARY CARE PHYSICIAN: Denton Lank, M.D.   ADMITTING DIAGNOSIS: Chest pain.   HISTORY OF PRESENT ILLNESS: This is an 81 year old Caucasian female who presents to the Emergency Department after being awakened from sleep by chest pain. The patient states the pain began under her left breast and radiated across her chest laterally to under the right breast. She states that it was pressure and a burning sensation similar to when one has IV dye injected. The patient states that her pain was not associated with shortness of breath, nausea, vomiting or diaphoresis. It did radiate to her left arm. She states it was 9 out of 10 in severity originally. Upon arrival to the Emergency Department, she was given sublingual nitroglycerin which relieved her pain to approximately 2 out of 10 in severity. However, the pain increased 2 hours later to approximately 3 out of 10 in severity, which prompted the Emergency Department to call for admission.   REVIEW OF SYSTEMS: CONSTITUTIONAL: The patient denies fever or weakness.  EYES: Denies blurred vision or inflammation.  EARS, NOSE AND THROAT: Denies tinnitus or sore throat.  RESPIRATORY: Denies cough, shortness of breath.  CARDIOVASCULAR: Admits to chest pain and palpitations but denies orthopnea or paroxysmal nocturnal dyspnea.  GASTROINTESTINAL: Denies nausea, vomiting or diarrhea.  GENITOURINARY: Denies dysuria, increased frequency or hesitancy of urination.  ENDOCRINE: Denies polyuria or polydipsia.  MUSCULOSKELETAL: Denies arthralgias or myalgias.  INTEGUMENTARY: Denies rashes or lesions.  NEUROLOGIC: Denies numbness in extremities or dysarthria.  PSYCHIATRIC: Denies depression or suicidal ideation.   PAST MEDICAL HISTORY: Hypertension, coronary artery disease, A-fib, COPD, and pulmonary fibrosis.    PAST SURGICAL HISTORY: Bilateral total knee replacement, cholecystectomy, bilateral tubal ligation, and hysterectomy.   SOCIAL HISTORY: The patient lives with a granddaughter. She does not smoke, drink, or do any drugs. She is a former Lawyer.   FAMILY HISTORY: The patient's daughter is a breast cancer survivor. All of her siblings have or have had coronary artery disease and diabetes type 2 seems to run in her family.   MEDICATIONS: 1.  Albuterol 90 mcg 2 puffs inhaled every 6 hours as needed for coughing, wheezing, or shortness of breath.  2.  Allopurinol 100 mg 1 tablet p.o. daily.  3.  Amiodarone 200 mg 1 tab p.o. daily.  4.  Amlodipine 5 mg 1 tablet p.o. daily.  5.  Calcium carbonate with vitamin D3 one tablet p.o. b.i.d.  6.  Cephalexin 250 mg 1 capsule p.o. daily (I think this prescription is completed).  7.  Clobetasol topical cream 0.05% applied to the effected area topically 2 times a day.  8.  Clonidine 0.2 mg 2 tablets p.o. b.i.d.  9.  Cyanocobalamin 1000 mcg 1 tablet p.o. daily.  10.  Dicyclomine 10 mg 1 capsule p.o. every 6 hours as needed for abdominal pain.  11.  Diltiazem hydrochloride CD 120 mg/24-hour oral capsule 1 capsule p.o. daily.  12.  Furosemide 20 mg 1 tablet p.o. daily.  13.  Hydroxyzine 10 mg 1 tablet p.o. at bedtime.  14.  Lopid 600 mg 1 tablet p.o. b.i.d.  15.  Metoprolol succinate 25 mg extended release 1 tablet p.o. daily.  16.  MiraLax oral powder for reconstitution 17 grams p.o. once a day with 8 ounces of water.  17.  Ondansetron 4 mg 1 tablet p.o. daily as needed  for nausea and vomiting.  18.  Pantoprazole 40 mg 1 tablet p.o. b.i.d.  19.  Warfarin 5 mg 1/2 tablet p.o. daily at bedtime.  20.  Quinapril 20 mg 1 tablet p.o. b.i.d.   ALLERGIES: SEPTRA AND ADHESIVE.  PERTINENT DIAGNOSTIC DATA: Serum glucose 113, BUN 24, creatinine 1.34, serum sodium 142, potassium 3.5, chloride 102, bicarb 29, calcium 9.7, serum albumin 4.1, alk phos 162,  AST 29, ALT 15. Troponin is less than 0.03. White blood cell count 4.1, hemoglobin 13.5, hematocrit 41.5, platelet count 232,000, MCV 91. Prothrombin 31.8, INR 3.1 and peaked APTT 54.4.   Chest x-ray shows hypoventilatory chest with bibasilar scarring. No new pulmonary processes.   PHYSICAL EXAMINATION: VITAL SIGNS: Temperature 97.9, pulse 64, respirations 16, blood pressure 155/72, pulse oximetry 96% on 2 liters of oxygen via nasal cannula.  GENERAL: The patient is alert and oriented x3, in no apparent distress.  HEENT: Normocephalic, atraumatic. Pupils equal, round, and reactive to light and accommodation. Extraocular movements are intact. Mucous membranes are moist.  NECK: Trachea is midline. No adenopathy. Thyroid is nonpalpable and nontender.  CHEST: Symmetric and atraumatic.  CARDIOVASCULAR: Irregularly irregular rate and rhythm. Normal S1, S2. There is a faint 3/6 systolic ejection murmur heard best over the aortic valve area. The patient has no carotid bruits and there are 2+ distal pulses at the brachial arteries as well as dorsalis pedis arteries.  LUNGS: Clear to auscultation bilaterally. Normal effort and excursion.  ABDOMEN: Positive bowel sounds. Soft, nontender, nondistended. No hepatosplenomegaly.  GENITOURINARY: Deferred.  MUSCULOSKELETAL: The patient moves all 4 extremities equally.  EXTREMITIES: No clubbing, cyanosis, or edema.  SKIN: Warm and dry. There are no rashes or lesions.  NEUROLOGIC: Cranial nerves II through XII are grossly intact.  PSYCHIATRIC: Mood is normal. Affect is congruent. The patient has excellent judgment and insight into her medical condition.   ASSESSMENT AND PLAN: This is an 81 year old female admitted for chest pain.  1.  Chest pain. It started at rest and initially decreased with nitroglycerin, but the pain has returned. The patient has known coronary artery disease, albeit relatively minimal, but we will still rule her out for acute coronary  syndrome. Her EKG shows only atrial fibrillation. There are no signs of ischemia. We will continue to follow her biomarkers and a cardiology consult has been placed.  2.  Coronary artery disease. The patient's recent catheterization was in October of 2015 which showed mild disease. The patient was given aspirin in the Emergency Department and we will continue her antihypertensive medications from home to minimize oxygen demand.  3.  Atrial fibrillation. We will continue warfarin and amiodarone.  4.  Hyperlipidemia. Continue gemfibrozil.  5.  Chronic obstructive pulmonary disease. This is partially a consequence of pulmonary fibrosis as the patient used to work in a [employer]. It could also be a function of amiodarone toxicity. I have ordered an amiodarone check as well. We will continue with albuterol as needed.  6.  Deep vein thrombosis prophylaxis. None due to current warfarin use.  7.  Gastrointestinal prophylaxis. None.   CODE STATUS: FULL.  TIME SPENT ON ADMISSION ORDERS AND PATIENT CARE: Approximately 35 minutes.  ____________________________ Norva Riffle. Marcille Blanco, MD msd:sb D: 09/02/2014 07:43:21 ET T: 09/02/2014 08:07:05 ET JOB#: [id number]  cc: Norva Riffle. Marcille Blanco, MD, <Dictator> Norva Riffle [place] Welcome MD ELECTRONICALLY SIGNED 09/03/2014 0:40

## 2014-10-10 NOTE — H&P (Signed)
PATIENT NAME:  Stefanie Bryan, [name] MR#:  [id number] DATE OF BIRTH:  1928/01/04  DATE OF ADMISSION:  09/07/2014  REFERRING PHYSICIAN: Rebecka ApleyAllison P. Webster, MD  PRIMARY CARE PHYSICIAN: Sarah "Maryruth HancockSallie" Patel, MD  CHIEF COMPLAINT: Nausea, vomiting.   HISTORY OF PRESENT ILLNESS: An 81 year old Caucasian female with past medical history of gastroesophageal reflux disease without esophagitis and chronic pancreatitis presenting with nausea, vomiting of 1 day duration. Of note, she originally presented to [facility] Emergency Department for chills of 1 day duration and was diagnosed with UTI and discharged with prescription for p.o. Levaquin. After she went home, she developed nausea and vomiting, nonbloody, nonbilious emesis, unable to tolerate her medications, thus represented to the hospital for further workup and evaluation. States she had 5 to 6 episodes of nausea and vomiting prior to presentation to the ER. Symptoms continued in the Emergency Department despite getting dosages of Zofran and Reglan.   REVIEW OF SYSTEMS:  CONSTITUTIONAL: Denies fevers. Positive for chills. Denies fatigue or weakness.   EYES: Denies blurred vision, double vision, or eye pain.  EARS, NOSE, THROAT: Denies tinnitus, ear pain, or hearing loss. RESPIRATORY: Denies cough, wheeze, shortness of breath.  CARDIOVASCULAR: Denies chest pain, palpitations, edema.  GASTROINTESTINAL: Positive for nausea and vomiting. Denies diarrhea or constipation.  GENITOURINARY: Denies dysuria or hematuria.  ENDOCRINE: Denies nocturia or thyroid problems.  HEMATOLOGIC AND LYMPHATIC: Denies easy bruising or bleeding.  SKIN: Denies rash or lesions.  MUSCULOSKELETAL: Denies pain in neck, back, shoulder, knees, hips, or arthritic symptoms.  NEUROLOGIC: Denies paralysis or paresthesias.  PSYCHIATRIC: Denies anxiety or depressive symptoms.   Otherwise, full review of systems performed by me is negative.   PAST MEDICAL HISTORY: Includes gastroesophageal  reflux disease without esophagitis; atrial fibrillation, chronic, on warfarin for anticoagulation; hypertension, essential; COPD, non-O2 dependent; coronary artery disease; pulmonary fibrosis, as well as chronic pancreatitis.   SOCIAL HISTORY: No alcohol, tobacco, or drug usage.   FAMILY HISTORY: Positive for coronary artery disease as well as diabetes.   ALLERGIES: SEPTRA AND ADHESIVE TAPE.   HOME MEDICATIONS: Quinapril 20 mg p.o. b.i.d., clonidine 0.2 mg 2 tablets p.o. b.i.d., amiodarone 200 mg p.o. daily, diltiazem 120 mg p.o. daily, warfarin 2.5 mg daily, Zofran 4 mg p.o. q. 8 hours as needed for nausea and vomiting, allopurinol 100 mg p.o. daily, Lopid 600 mg p.o. b.i.d., hydroxyzine 10 mg p.o. at bedtime, metoprolol succinate 25 mg p.o. daily, albuterol 90 mcg inhalation 2 puffs q. 6 hours as needed for wheezing, Norvasc 5 mg p.o. b.i.d., Lasix 20 mg p.o. daily, dicyclomine 10 mg p.o. q. 6 hours as needed for abdominal pain, MiraLax 17 grams daily, pantoprazole 40 mg p.o. daily, calcium plus D 600/800 one tablet b.i.d., cyanocobalamin 1000 mcg p.o. daily.   PHYSICAL EXAMINATION:  VITAL SIGNS: Temperature 98.1, heart rate 106, respirations 16, blood pressure 139/77, saturating 98% on room air. Weight 65.8 kg, BMI 25.7.  GENERAL: Chronically ill-appearing Caucasian female currently in no acute distress.  HEAD: Normocephalic, atraumatic.  EYES: Pupils equal, round, reactive to light. Extraocular muscles intact. No scleral icterus.  MOUTH: Dry mucosal membranes. Dentition intact. No abscess noted.  EARS, NOSE, THROAT: Clear without exudates. No external lesions.  NECK: Supple. No thyromegaly. No nodules. No JVD.  PULMONARY: Clear to auscultation bilaterally without wheezes, rales, or rhonchi. No use of accessory muscles. Good respiratory effort.  CHEST: Nontender to palpation.  CARDIOVASCULAR: S1, S2, irregular rate and rhythm with a 2 to 3/6 systolic ejection murmur. No edema. Pedal pulses 2+  bilaterally.  GASTROINTESTINAL: Soft, nontender, nondistended. No masses. Positive bowel sounds. No hepatosplenomegaly.  MUSCULOSKELETAL: No swelling, clubbing, or edema. Range of motion full in all extremities.  NEUROLOGIC: Cranial nerves II through XII intact. No gross focal neurological deficits.  Sensation intact. Reflexes intact.  SKIN: No ulceration, lesions, rashes, or cyanosis. Skin warm and dry. Turgor intact.  PSYCHIATRIC: Mood and affect within normal limits. She is awake, alert, oriented x 3. Insight and judgment intact.   LABORATORY DATA: Sodium of 147, chloride 102, bicarbonate of 33, BUN 24, creatinine 1.29, glucose of 157. WBC of 4.3, hemoglobin 12.5, platelets of 185,000. Urinalysis: WBC 103, RBC 6, leukocyte esterase 3+, nitrite positive.   ASSESSMENT AND PLAN: An 81 year old Caucasian female with history of chronic atrial fibrillation on warfarin for anticoagulation, presenting with nausea and vomiting. 1.  Intractable nausea, vomiting, unspecified: Intravenous fluid hydration. Hold diuretics. Provide as needed medications including Zofran. 2.  UTI, site unspecified"  Follow culture data. Continue with Levaquin as dosed in the Emergency Department.  3.  Hypernatremia: Intravenous fluid hydration with normal saline. Follow sodium levels.  4.  Atrial fibrillation, chronic: Continue with amiodarone, warfarin. 5.  Venous thromboembolism prophylaxis with therapeutic warfarin.   CODE STATUS: The patient is a FULL CODE.   TIME SPENT: 35 minutes.    ____________________________ Cletis Athens. Krue Peterka, MD dkh:TT D: 09/07/2014 02:27:01 ET T: 09/07/2014 03:02:31 ET JOB#: [id number]  cc: Cletis Athens. Daisie Haft, MD, <Dictator> Daleena Rotter Synetta Shadow MD ELECTRONICALLY SIGNED 09/07/2014 22:47

## 2014-11-21 ENCOUNTER — Encounter: Payer: Self-pay | Admitting: Emergency Medicine

## 2014-11-21 ENCOUNTER — Inpatient Hospital Stay
Admission: EM | Admit: 2014-11-21 | Discharge: 2014-11-24 | DRG: 193 | Disposition: A | Discharge status: Home Health | Payer: Medicare Other | Attending: Internal Medicine | Admitting: Internal Medicine

## 2014-11-21 ENCOUNTER — Emergency Department: Payer: Medicare Other

## 2014-11-21 ENCOUNTER — Inpatient Hospital Stay
Admit: 2014-11-21 | Discharge: 2014-11-21 | Disposition: A | Discharge status: Home / Self Care | Payer: Medicare Other | Attending: Internal Medicine | Admitting: Internal Medicine

## 2014-11-21 DIAGNOSIS — Z66 Do not resuscitate: Secondary | ICD-10-CM | POA: Diagnosis present

## 2014-11-21 DIAGNOSIS — J441 Chronic obstructive pulmonary disease with (acute) exacerbation: Secondary | ICD-10-CM | POA: Diagnosis present

## 2014-11-21 DIAGNOSIS — R011 Cardiac murmur, unspecified: Secondary | ICD-10-CM | POA: Diagnosis present

## 2014-11-21 DIAGNOSIS — K219 Gastro-esophageal reflux disease without esophagitis: Secondary | ICD-10-CM | POA: Diagnosis present

## 2014-11-21 DIAGNOSIS — J9621 Acute and chronic respiratory failure with hypoxia: Secondary | ICD-10-CM | POA: Diagnosis present

## 2014-11-21 DIAGNOSIS — I509 Heart failure, unspecified: Secondary | ICD-10-CM | POA: Diagnosis present

## 2014-11-21 DIAGNOSIS — J8410 Pulmonary fibrosis, unspecified: Secondary | ICD-10-CM | POA: Diagnosis present

## 2014-11-21 DIAGNOSIS — R0602 Shortness of breath: Secondary | ICD-10-CM | POA: Diagnosis present

## 2014-11-21 DIAGNOSIS — Z9981 Dependence on supplemental oxygen: Secondary | ICD-10-CM

## 2014-11-21 DIAGNOSIS — Z9889 Other specified postprocedural states: Secondary | ICD-10-CM

## 2014-11-21 DIAGNOSIS — Z90710 Acquired absence of both cervix and uterus: Secondary | ICD-10-CM

## 2014-11-21 DIAGNOSIS — M349 Systemic sclerosis, unspecified: Secondary | ICD-10-CM | POA: Diagnosis present

## 2014-11-21 DIAGNOSIS — R002 Palpitations: Secondary | ICD-10-CM | POA: Diagnosis present

## 2014-11-21 DIAGNOSIS — I129 Hypertensive chronic kidney disease with stage 1 through stage 4 chronic kidney disease, or unspecified chronic kidney disease: Secondary | ICD-10-CM | POA: Diagnosis present

## 2014-11-21 DIAGNOSIS — I4891 Unspecified atrial fibrillation: Secondary | ICD-10-CM | POA: Diagnosis present

## 2014-11-21 DIAGNOSIS — Z79899 Other long term (current) drug therapy: Secondary | ICD-10-CM

## 2014-11-21 DIAGNOSIS — N183 Chronic kidney disease, stage 3 (moderate): Secondary | ICD-10-CM | POA: Diagnosis present

## 2014-11-21 DIAGNOSIS — Z9049 Acquired absence of other specified parts of digestive tract: Secondary | ICD-10-CM | POA: Diagnosis present

## 2014-11-21 DIAGNOSIS — J189 Pneumonia, unspecified organism: Secondary | ICD-10-CM | POA: Diagnosis present

## 2014-11-21 DIAGNOSIS — Z882 Allergy status to sulfonamides status: Secondary | ICD-10-CM | POA: Diagnosis not present

## 2014-11-21 DIAGNOSIS — J449 Chronic obstructive pulmonary disease, unspecified: Secondary | ICD-10-CM | POA: Diagnosis present

## 2014-11-21 DIAGNOSIS — Z7901 Long term (current) use of anticoagulants: Secondary | ICD-10-CM | POA: Diagnosis not present

## 2014-11-21 DIAGNOSIS — I10 Essential (primary) hypertension: Secondary | ICD-10-CM | POA: Diagnosis present

## 2014-11-21 DIAGNOSIS — Z9851 Tubal ligation status: Secondary | ICD-10-CM | POA: Diagnosis not present

## 2014-11-21 HISTORY — DX: Gastro-esophageal reflux disease without esophagitis: K21.9

## 2014-11-21 HISTORY — DX: Heart failure, unspecified: I50.9

## 2014-11-21 HISTORY — DX: Chronic obstructive pulmonary disease, unspecified: J44.9

## 2014-11-21 HISTORY — DX: Unspecified atrial fibrillation: I48.91

## 2014-11-21 HISTORY — DX: Essential (primary) hypertension: I10

## 2014-11-21 LAB — COMPREHENSIVE METABOLIC PANEL
ALT: 13 U/L — AB (ref 14–54)
AST: 19 U/L (ref 15–41)
Albumin: 3.5 g/dL (ref 3.5–5.0)
Alkaline Phosphatase: 101 U/L (ref 38–126)
Anion gap: 11 (ref 5–15)
BUN: 48 mg/dL — AB (ref 6–20)
CALCIUM: 8.9 mg/dL (ref 8.9–10.3)
CO2: 32 mmol/L (ref 22–32)
CREATININE: 1.47 mg/dL — AB (ref 0.44–1.00)
Chloride: 101 mmol/L (ref 101–111)
GFR calc non Af Amer: 32 mL/min — ABNORMAL LOW (ref >60)
GFR, EST AFRICAN AMERICAN: 37 mL/min — AB (ref >60)
Glucose, Bld: 135 mg/dL — ABNORMAL HIGH (ref 65–99)
POTASSIUM: 4 mmol/L (ref 3.5–5.1)
Sodium: 144 mmol/L (ref 135–145)
Total Bilirubin: 0.5 mg/dL (ref 0.3–1.2)
Total Protein: 6.8 g/dL (ref 6.5–8.1)

## 2014-11-21 LAB — PROTIME-INR
INR: 2.06
PROTHROMBIN TIME: 23.4 s — AB (ref 11.4–15.0)

## 2014-11-21 LAB — CBC
HCT: 29 % — ABNORMAL LOW (ref 35.0–47.0)
HEMOGLOBIN: 9.7 g/dL — AB (ref 12.0–16.0)
MCH: 31.2 pg (ref 26.0–34.0)
MCHC: 33.4 g/dL (ref 32.0–36.0)
MCV: 93.3 fL (ref 80.0–100.0)
Platelets: 254 10*3/uL (ref 150–440)
RBC: 3.11 MIL/uL — ABNORMAL LOW (ref 3.80–5.20)
RDW: 14.3 % (ref 11.5–14.5)
WBC: 5.6 10*3/uL (ref 3.6–11.0)

## 2014-11-21 LAB — TROPONIN I: Troponin I: <0.03 ng/mL (ref <0.031)

## 2014-11-21 LAB — EXPECTORATED SPUTUM ASSESSMENT W GRAM STAIN, RFLX TO RESP C

## 2014-11-21 LAB — EXPECTORATED SPUTUM ASSESSMENT W REFEX TO RESP CULTURE

## 2014-11-21 LAB — BRAIN NATRIURETIC PEPTIDE: B Natriuretic Peptide: 180 pg/mL — ABNORMAL HIGH (ref 0.0–100.0)

## 2014-11-21 MED ORDER — DEXTROSE 5 % IV SOLN
INTRAVENOUS | Status: AC
  Administered 2014-11-21: 500 mg via INTRAVENOUS
  Filled 2014-11-21: qty 500

## 2014-11-21 MED ORDER — PANTOPRAZOLE SODIUM 40 MG PO TBEC
40.0000 mg | DELAYED_RELEASE_TABLET | Freq: Every day | ORAL | Status: DC
  Administered 2014-11-21 – 2014-11-24 (×4): 40 mg via ORAL
  Filled 2014-11-21 (×4): qty 1

## 2014-11-21 MED ORDER — ALLOPURINOL 100 MG PO TABS
100.0000 mg | ORAL_TABLET | Freq: Every day | ORAL | Status: DC
  Administered 2014-11-21 – 2014-11-24 (×4): 100 mg via ORAL
  Filled 2014-11-21 (×4): qty 1

## 2014-11-21 MED ORDER — WARFARIN SODIUM 2.5 MG PO TABS: 5.0000 mg | ORAL_TABLET | ORAL | Status: DC

## 2014-11-21 MED ORDER — ACETAMINOPHEN 650 MG RE SUPP: 650.0000 mg | Freq: Four times a day (QID) | RECTAL | Status: DC | PRN

## 2014-11-21 MED ORDER — ACETAMINOPHEN 325 MG PO TABS: 650.0000 mg | ORAL_TABLET | Freq: Four times a day (QID) | ORAL | Status: DC | PRN

## 2014-11-21 MED ORDER — ONDANSETRON HCL 4 MG PO TABS: 4.0000 mg | ORAL_TABLET | Freq: Four times a day (QID) | ORAL | Status: DC | PRN

## 2014-11-21 MED ORDER — IPRATROPIUM-ALBUTEROL 0.5-2.5 (3) MG/3ML IN SOLN
RESPIRATORY_TRACT | Status: AC
  Administered 2014-11-21: 3 mL via RESPIRATORY_TRACT
  Filled 2014-11-21: qty 3

## 2014-11-21 MED ORDER — IPRATROPIUM-ALBUTEROL 0.5-2.5 (3) MG/3ML IN SOLN
3.0000 mL | Freq: Once | RESPIRATORY_TRACT | Status: AC
  Administered 2014-11-21: 3 mL via RESPIRATORY_TRACT

## 2014-11-21 MED ORDER — AMLODIPINE BESYLATE 5 MG PO TABS
5.0000 mg | ORAL_TABLET | Freq: Two times a day (BID) | ORAL | Status: DC
  Administered 2014-11-21 – 2014-11-24 (×6): 5 mg via ORAL
  Filled 2014-11-21 (×7): qty 1

## 2014-11-21 MED ORDER — WARFARIN SODIUM 2.5 MG PO TABS: 2.5000 mg | ORAL_TABLET | ORAL | Status: DC

## 2014-11-21 MED ORDER — WARFARIN - PHYSICIAN DOSING INPATIENT
Freq: Every day | Status: DC
  Administered 2014-11-21: 17:00:00

## 2014-11-21 MED ORDER — AMIODARONE HCL 200 MG PO TABS
200.0000 mg | ORAL_TABLET | Freq: Every day | ORAL | Status: DC
  Administered 2014-11-21 – 2014-11-24 (×3): 200 mg via ORAL
  Filled 2014-11-21 (×4): qty 1

## 2014-11-21 MED ORDER — CEFTRIAXONE SODIUM IN DEXTROSE 20 MG/ML IV SOLN
1.0000 g | INTRAVENOUS | Status: DC
  Administered 2014-11-21 – 2014-11-24 (×4): 1 g via INTRAVENOUS
  Filled 2014-11-21 (×4): qty 50

## 2014-11-21 MED ORDER — METHYLPREDNISOLONE SODIUM SUCC 125 MG IJ SOLR
60.0000 mg | Freq: Three times a day (TID) | INTRAMUSCULAR | Status: DC
  Administered 2014-11-21 – 2014-11-23 (×7): 60 mg via INTRAVENOUS
  Filled 2014-11-21 (×7): qty 2

## 2014-11-21 MED ORDER — CEFTRIAXONE SODIUM IN DEXTROSE 20 MG/ML IV SOLN
INTRAVENOUS | Status: AC
  Administered 2014-11-21: 1 g via INTRAVENOUS
  Filled 2014-11-21: qty 50

## 2014-11-21 MED ORDER — METHYLPREDNISOLONE SODIUM SUCC 125 MG IJ SOLR
INTRAMUSCULAR | Status: AC
  Administered 2014-11-21: 125 mg via INTRAVENOUS
  Filled 2014-11-21: qty 2

## 2014-11-21 MED ORDER — METOPROLOL SUCCINATE ER 25 MG PO TB24
25.0000 mg | ORAL_TABLET | Freq: Every day | ORAL | Status: DC
  Administered 2014-11-21 – 2014-11-24 (×3): 25 mg via ORAL
  Filled 2014-11-21 (×4): qty 1

## 2014-11-21 MED ORDER — DEXTROSE 5 % IV SOLN
500.0000 mg | INTRAVENOUS | Status: DC
  Administered 2014-11-22 – 2014-11-23 (×2): 500 mg via INTRAVENOUS
  Filled 2014-11-21 (×3): qty 500

## 2014-11-21 MED ORDER — WARFARIN SODIUM 2.5 MG PO TABS
2.5000 mg | ORAL_TABLET | ORAL | Status: DC
  Administered 2014-11-21: 2.5 mg via ORAL
  Filled 2014-11-21: qty 1

## 2014-11-21 MED ORDER — SENNOSIDES-DOCUSATE SODIUM 8.6-50 MG PO TABS
1.0000 | ORAL_TABLET | Freq: Every evening | ORAL | Status: DC | PRN
Start: 2014-11-21 — End: 2014-11-24

## 2014-11-21 MED ORDER — FUROSEMIDE 20 MG PO TABS: 20.0000 mg | ORAL_TABLET | Freq: Every day | ORAL | Status: DC | PRN

## 2014-11-21 MED ORDER — IPRATROPIUM-ALBUTEROL 0.5-2.5 (3) MG/3ML IN SOLN: 3.0000 mL | RESPIRATORY_TRACT | Status: DC | PRN

## 2014-11-21 MED ORDER — METHYLPREDNISOLONE SODIUM SUCC 125 MG IJ SOLR
125.0000 mg | Freq: Once | INTRAMUSCULAR | Status: AC
  Administered 2014-11-21: 125 mg via INTRAVENOUS

## 2014-11-21 MED ORDER — ONDANSETRON HCL 4 MG/2ML IJ SOLN: 4.0000 mg | Freq: Four times a day (QID) | INTRAMUSCULAR | Status: DC | PRN

## 2014-11-21 MED ORDER — SODIUM CHLORIDE 0.9 % IJ SOLN
3.0000 mL | Freq: Two times a day (BID) | INTRAMUSCULAR | Status: DC
  Administered 2014-11-21 – 2014-11-23 (×6): 3 mL via INTRAVENOUS

## 2014-11-21 MED ORDER — DEXTROSE 5 % IV SOLN
500.0000 mg | Freq: Once | INTRAVENOUS | Status: AC
  Administered 2014-11-21: 500 mg via INTRAVENOUS

## 2014-11-21 NOTE — H&P (Signed)
[facility] - [place] at [facility]   PATIENT NAME: Stefanie Bryan    MR#:  [id number]  DATE OF BIRTH:  [birth year]  DATE OF ADMISSION:  11/21/2014  PRIMARY CARE PHYSICIAN: Hillery Aldo, MD   REQUESTING/REFERRING PHYSICIAN: Dolores Frame  CHIEF COMPLAINT:   Chief Complaint  Patient presents with  . Shortness of Breath    HISTORY OF PRESENT ILLNESS:  Stefanie Bryan  is a 81 y.o. female who presents with 3 days of increasingly progressive shortness of breath. Patient states that she has had some mild cough, without much sputum production. She denies fevers/chills, chest pain, nausea/vomiting, or other infectious symptoms other than the shortness of breath with cough. She came to the ED because of shortness of breath became so bad that she was concerned. Here she was found to have likely left pneumonia on chest x-ray. Hospitalists were called for admission for community-acquired pneumonia. Of note she does have a history of COPD and CHF, as well as A. fib for which she is anticoagulated with Coumadin.  PAST MEDICAL HISTORY:   Past Medical History  Diagnosis Date  . COPD (chronic obstructive pulmonary disease)   . CHF (congestive heart failure)   . Hypertension   . Atrial fibrillation   . GERD (gastroesophageal reflux disease)     PAST SURGICAL HISTORY:   Past Surgical History  Procedure Laterality Date  . Abdominal hysterectomy    . Tubal ligation    . Cholecystectomy    . Knee surgery      right and left    SOCIAL HISTORY:   History  Substance Use Topics  . Smoking status: Never Smoker   . Smokeless tobacco: Never Used  . Alcohol Use: No    FAMILY HISTORY:   Family History  Problem Relation Age of Onset  . Diverticulitis Brother     DRUG ALLERGIES:   Allergies  Allergen Reactions  . Sulfa Antibiotics     MEDICATIONS AT HOME:   Prior to Admission medications   Medication Sig Start Date End Date Taking? Authorizing Provider   allopurinol (ZYLOPRIM) 100 MG tablet Take 100 mg by mouth daily.   Yes Historical Provider, MD  amiodarone (PACERONE) 200 MG tablet Take 200 mg by mouth daily.   Yes Historical Provider, MD  amLODipine (NORVASC) 5 MG tablet Take 5 mg by mouth 2 (two) times daily.   Yes Historical Provider, MD  Calcium Carbonate-Vitamin D (CALCIUM 600+D) 600-400 MG-UNIT per tablet Take 1 tablet by mouth daily.   Yes Historical Provider, MD  diltiazem (DILACOR XR) 120 MG 24 hr capsule Take 120 mg by mouth daily.   Yes Historical Provider, MD  furosemide (LASIX) 20 MG tablet Take 20 mg by mouth daily as needed (weight gain/ shortness of breath).   Yes Historical Provider, MD  gemfibrozil (LOPID) 600 MG tablet Take 600 mg by mouth 2 (two) times daily before a meal.   Yes Historical Provider, MD  metoprolol succinate (TOPROL-XL) 25 MG 24 hr tablet Take 25 mg by mouth daily.   Yes Historical Provider, MD  pantoprazole (PROTONIX) 40 MG tablet Take 40 mg by mouth daily.   Yes Historical Provider, MD  predniSONE (DELTASONE) 10 MG tablet Take 10 mg by mouth daily with breakfast. Take 4 tablets daily for 2 weeks,  then 3 tablets daily for 2 weeks (starting 06/12),  then 2 tablets daily for 2 weeks,  then one tablet daily for 2 weeks 11/11/14  Yes Historical Provider, MD  vitamin B-12 (  CYANOCOBALAMIN) 1000 MCG tablet Take 1,000 mcg by mouth daily.   Yes Historical Provider, MD  warfarin (COUMADIN) 5 MG tablet Take 2.5-5 mg by mouth daily at 6 PM. 5mg  on Tuesday, 2.5mg  all other days   Yes Historical Provider, MD    REVIEW OF SYSTEMS:  Review of Systems  Constitutional: Negative for fever, chills, weight loss and malaise/fatigue.  HENT: Negative for ear pain, hearing loss and tinnitus.   Eyes: Negative for blurred vision, double vision, pain and redness.  Respiratory: Positive for cough and shortness of breath. Negative for hemoptysis.   Cardiovascular: Negative for chest pain, palpitations, orthopnea and leg swelling.   Gastrointestinal: Negative for nausea, vomiting, abdominal pain, diarrhea and constipation.  Genitourinary: Negative for dysuria, frequency and hematuria.  Musculoskeletal: Negative for back pain, joint pain and neck pain.  Skin:       No acne, rash, or lesions  Neurological: Negative for dizziness, tremors, focal weakness and weakness.  Endo/Heme/Allergies: Negative for polydipsia. Does not bruise/bleed easily.  Psychiatric/Behavioral: Negative for depression. The patient is not nervous/anxious and does not have insomnia.      VITAL SIGNS:   Filed Vitals:   11/21/14 0200 11/21/14 0230 11/21/14 0300 11/21/14 0413  BP: 116/49 126/49 120/54 126/65  Pulse: 55 57 57 57  Temp:      TempSrc:      Resp: 18 16 16 18   Height:      Weight:      SpO2: 97% 96% 97% 95%   Wt Readings from Last 3 Encounters:  11/21/14 69.854 kg (154 lb)    PHYSICAL EXAMINATION:  Physical Exam  Constitutional: She is oriented to person, place, and time. She appears well-developed and well-nourished. No distress.  HENT:  Head: Normocephalic and atraumatic.  Mouth/Throat: Oropharynx is clear and moist.  Eyes: Conjunctivae and EOM are normal. Pupils are equal, round, and reactive to light. No scleral icterus.  Neck: Normal range of motion. Neck supple. No JVD present. No thyromegaly present.  Cardiovascular: Normal rate, regular rhythm and intact distal pulses.  Exam reveals no gallop and no friction rub.   Murmur (systolic) heard. Respiratory: She is in respiratory distress (mild, comfortable on O2 via nasal cannula). She has no wheezes. She has rales.  GI: Soft. Bowel sounds are normal. She exhibits no distension. There is no tenderness.  Musculoskeletal: Normal range of motion. She exhibits no edema.  No arthritis, no gout  Lymphadenopathy:    She has no cervical adenopathy.  Neurological: She is alert and oriented to person, place, and time. No cranial nerve deficit.  No dysarthria, no aphasia  Skin:  Skin is warm and dry. No rash noted. No erythema.  Psychiatric: She has a normal mood and affect. Her behavior is normal. Judgment and thought content normal.    LABORATORY PANEL:   CBC  Recent Labs Lab 11/21/14 0229  WBC 5.6  HGB 9.7*  HCT 29.0*  PLT 254   ------------------------------------------------------------------------------------------------------------------  Chemistries   Recent Labs Lab 11/21/14 0229  NA 144  K 4.0  CL 101  CO2 32  GLUCOSE 135*  BUN 48*  CREATININE 1.47*  CALCIUM 8.9  AST 19  ALT 13*  ALKPHOS 101  BILITOT 0.5   ------------------------------------------------------------------------------------------------------------------  Cardiac Enzymes  Recent Labs Lab 11/21/14 0229  TROPONINI <0.03   ------------------------------------------------------------------------------------------------------------------  RADIOLOGY:  Dg Chest Port 1 View  11/21/2014   CLINICAL DATA:  Increasing dyspnea over the past few days.  EXAM: PORTABLE CHEST - 1 VIEW  COMPARISON:  09/06/2014  FINDINGS: There is confluent airspace opacity in the central left lung consistent with infectious infiltrate. This is new/worsened from 09/06/2014. Chronic unchanged linear scarring is present in the right lung. No large effusions are evident.  IMPRESSION: Left lung infiltrate, likely infectious.   Electronically Signed   By: Ellery Plunk M.D.   On: 11/21/2014 02:49    EKG:   Orders placed or performed during the hospital encounter of 11/21/14  . EKG 12-Lead  . EKG 12-Lead    IMPRESSION AND PLAN:  Principal Problem:   CAP (community acquired pneumonia) - continue O2 via nasal cannula, IV antibiotics, duo nebs when necessary, and IV steroids for his COPD see below. Active Problems:   COPD (chronic obstructive pulmonary disease) - was recently placed on a steroid taper by her outpatient physician, we'll continue steroids as part of treatment regimen while here  in the hospital.   CHF (congestive heart failure) - per her history unspecified type, will get an echocardiogram also to evaluate her heart murmur, also check BNP.   HTN (hypertension) - chronic stable problem, continue home antihypertensives.   A-fib - on Coumadin for anticoagulation, continue this here, check INR, continue antiarrhythmic and rate controlling medications, but we will hold some of these at this time is her heart rate is low.   GERD (gastroesophageal reflux disease) - continue PPI at home dose   All the records are reviewed and case discussed with ED provider. Management plans discussed with the patient and/or family.  DVT PROPHYLAXIS: systemic anticoagulation  ADMISSION STATUS: Inpatient  CODE STATUS: DO NOT RESUSCITATE  TOTAL TIME TAKING CARE OF THIS PATIENT: 45 minutes.    Yamil Oelke FIELDING 11/21/2014, 4:41 AM  [facility]  Office  [phone]  CC: Primary care physician; Hillery Aldo, MD

## 2014-11-21 NOTE — ED Provider Notes (Signed)
[facility] Emergency Department Provider Note  ____________________________________________  Time seen: Approximately 2:09 AM  I have reviewed the triage vital signs and the nursing notes.   HISTORY  Chief Complaint Shortness of Breath    HPI Stefanie Bryan is a 81 y.o. female who presents via EMS from home with a chief complaint of shortness of breath. Patient has a history of pulmonary fibrosis as well as COPD and is on home oxygen at 3L continuously. States she has had a 2 day history of nonproductive cough associated with chest tightness. Patient denies fever, chills, vomiting, diarrhea, headache, numbness, tingling. Patient was given 1 DuoNeb en route via EMS. Patient is currently on a 4 week prednisone taper prescribed by her PCP.   Past Medical History  Diagnosis Date  . COPD (chronic obstructive pulmonary disease)   . CHF (congestive heart failure)   . Hypertension   . Atrial fibrillation     There are no active problems to display for this patient.   Past Surgical History  Procedure Laterality Date  . Abdominal hysterectomy    . Tubal ligation    . Cholecystectomy    . Knee surgery      right and left    Current Outpatient Rx  Name  Route  Sig  Dispense  Refill  . allopurinol (ZYLOPRIM) 100 MG tablet   Oral   Take 100 mg by mouth daily.         Marland Kitchen amiodarone (PACERONE) 200 MG tablet   Oral   Take 200 mg by mouth daily.         Marland Kitchen amLODipine (NORVASC) 5 MG tablet   Oral   Take 5 mg by mouth 2 (two) times daily.         . Calcium Carbonate-Vitamin D (CALCIUM 600+D) 600-400 MG-UNIT per tablet   Oral   Take 1 tablet by mouth daily.         Marland Kitchen diltiazem (DILACOR XR) 120 MG 24 hr capsule   Oral   Take 120 mg by mouth daily.         . furosemide (LASIX) 20 MG tablet   Oral   Take 20 mg by mouth daily as needed (weight gain/ shortness of breath).         Marland Kitchen gemfibrozil (LOPID) 600 MG tablet   Oral   Take 600 mg  by mouth 2 (two) times daily before a meal.         . metoprolol succinate (TOPROL-XL) 25 MG 24 hr tablet   Oral   Take 25 mg by mouth daily.         . pantoprazole (PROTONIX) 40 MG tablet   Oral   Take 40 mg by mouth daily.         . predniSONE (DELTASONE) 10 MG tablet   Oral   Take 10 mg by mouth daily with breakfast. Take 4 tablets daily for 2 weeks,  then 3 tablets daily for 2 weeks (starting 06/12),  then 2 tablets daily for 2 weeks,  then one tablet daily for 2 weeks         . vitamin B-12 (CYANOCOBALAMIN) 1000 MCG tablet   Oral   Take 1,000 mcg by mouth daily.         Marland Kitchen warfarin (COUMADIN) 5 MG tablet   Oral   Take 2.5-5 mg by mouth daily at 6 PM. 5mg  on Tuesday, 2.5mg  all other days  Allergies Sulfa antibiotics  History reviewed. No pertinent family history.  Social History History  Substance Use Topics  . Smoking status: Never Smoker   . Smokeless tobacco: Never Used  . Alcohol Use: No    Review of Systems Constitutional: No fever/chills Eyes: No visual changes. ENT: No sore throat. Cardiovascular: Positive for chest tightness. Respiratory: Positive for nonproductive cough and shortness of breath. Gastrointestinal: No abdominal pain.  No nausea, no vomiting.  No diarrhea.  No constipation. Genitourinary: Negative for dysuria. Musculoskeletal: Negative for back pain. Skin: Negative for rash. Neurological: Negative for headaches, focal weakness or numbness.  10-point ROS otherwise negative.  ____________________________________________   PHYSICAL EXAM:  VITAL SIGNS: ED Triage Vitals  Enc Vitals Group     BP 11/21/14 0114 128/56 mmHg     Pulse Rate 11/21/14 0114 65     Resp 11/21/14 0114 20     Temp 11/21/14 0114 98.4 F (36.9 C)     Temp Source 11/21/14 0114 Oral     SpO2 11/21/14 0114 96 %     Weight 11/21/14 0114 154 lb (69.854 kg)     Height 11/21/14 0114  (1.6 m)     Head Cir --      Peak Flow --      Pain  Score --      Pain Loc --      Pain Edu? --      Excl. in GC? --     Constitutional: Alert and oriented. Well appearing and in mild acute distress. Eyes: Conjunctivae are normal. PERRL. EOMI. Head: Atraumatic. Nose: No congestion/rhinnorhea. Mouth/Throat: Mucous membranes are moist.  Oropharynx non-erythematous. Neck: No stridor.   Cardiovascular: Normal rate, regular rhythm. Grossly normal heart sounds.  Good peripheral circulation. Respiratory: Increased work of breathing. Rales diffusely with scattered wheezing and rhonchi. Gastrointestinal: Soft and nontender. No distention. No abdominal bruits. No CVA tenderness. Musculoskeletal: No lower extremity tenderness nor edema.  No joint effusions. Neurologic:  Normal speech and language. No gross focal neurologic deficits are appreciated. Speech is normal.  Skin:  Skin is warm, dry and intact. No rash noted. Psychiatric: Mood and affect are normal. Speech and behavior are normal.  ____________________________________________   LABS (all labs ordered are listed, but only abnormal results are displayed)  Labs Reviewed  CBC - Abnormal; Notable for the following:    RBC 3.11 (*)    Hemoglobin 9.7 (*)    HCT 29.0 (*)    All other components within normal limits  COMPREHENSIVE METABOLIC PANEL - Abnormal; Notable for the following:    Glucose, Bld 135 (*)    BUN 48 (*)    Creatinine, Ser 1.47 (*)    ALT 13 (*)    GFR calc non Af Amer 32 (*)    GFR calc Af Amer 37 (*)    All other components within normal limits  CULTURE, BLOOD (ROUTINE X 2)  CULTURE, BLOOD (ROUTINE X 2)  TROPONIN I   ____________________________________________  EKG  ED ECG REPORT I, Shaylinn Hladik J, the attending physician, personally viewed and interpreted this ECG.   Date: 11/21/2014  EKG Time: 0107  Rate: 73  Rhythm: normal EKG, normal sinus rhythm  Axis: Normal  Intervals:none  ST&T Change:  Nonspecific  ____________________________________________  RADIOLOGY  Portable chest x-ray (viewed by me, interpreted by Dr. Clovis Riley): Left lung infiltrate, likely infectious.  ____________________________________________   PROCEDURES  Procedure(s) performed: None  Critical Care performed: No  ____________________________________________   INITIAL IMPRESSION / ASSESSMENT  AND PLAN / ED COURSE  Pertinent labs & imaging results that were available during my care of the patient were reviewed by me and considered in my medical decision making (see chart for details).  81 year old female with history of pulmonary fibrosis and COPD with increased shortness of breath 2 days. Will initiate DuoNeb, IV Solu-Medrol, check labs including troponin, chest x-ray. Patient may require IV Lasix for diuresis.  ----------------------------------------- 4:12 AM on 11/21/2014 -----------------------------------------  Updated patient and daughter of lab and x-ray results. IV antibiotics ordered. Will discuss case with hospitalist for admission. ____________________________________________   FINAL CLINICAL IMPRESSION(S) / ED DIAGNOSES  Final diagnoses:  Shortness of breath  Chronic obstructive pulmonary disease with acute exacerbation  Pulmonary fibrosis  Acute on chronic congestive heart failure, unspecified congestive heart failure type  Community acquired pneumonia      Irean Hong, MD 11/21/14 [phone]

## 2014-11-21 NOTE — ED Notes (Signed)
Pt to ED from home via EMS c/o SOB.  Pt states started a couple days and progressively got worse today.  Pt states unproductive cough as well.  Pt treated at home with albuterol around 2000.  Pt given 1 duoneb en route.  EMS states bilateral wheeze before treatment.  Pt lungs clear x4 on arrival, A&Ox4, speaking in complete and coherent sentences and in NAD at this time.

## 2014-11-21 NOTE — Progress Notes (Signed)
Patient ID: Stefanie Bryan, female   DOB: 11-21-1927, 81 y.o.   MRN: [id number] [facility] PROGRESS NOTE  PCP: Hillery Aldo, MD  HPI/Subjective: Patient brought her atrial fibrillation was acting up and felt palpitations yesterday. She came in with shortness of breath. She chronically wears 3 L of oxygen at home secondary to pulmonary fibrosis. Some cough but not much sputum production.  Objective: Filed Vitals:   11/21/14 0743  BP: 126/54  Pulse: 63  Temp: 97.6 F (36.4 C)  Resp: 20    Intake/Output Summary (Last 24 hours) at 11/21/14 0804 Last data filed at 11/21/14 0600  Gross per 24 hour  Intake    540 ml  Output      0 ml  Net    540 ml   Filed Weights   11/21/14 0114 11/21/14 0513  Weight: 69.854 kg (154 lb) 68.8 kg (151 lb 10.8 oz)    ROS: Review of Systems  Constitutional: Negative for fever and chills.  Eyes: Negative for blurred vision.  Respiratory: Positive for cough and shortness of breath. Negative for hemoptysis and sputum production.   Cardiovascular: Positive for palpitations. Negative for chest pain.  Gastrointestinal: Negative for nausea, vomiting, abdominal pain, diarrhea and constipation.  Genitourinary: Negative for dysuria.  Musculoskeletal: Negative for joint pain.  Neurological: Negative for dizziness and headaches.   Exam: Physical Exam  HENT:  Nose: No mucosal edema.  Mouth/Throat: No oropharyngeal exudate or posterior oropharyngeal edema.  Eyes: Conjunctivae, EOM and lids are normal. Pupils are equal, round, and reactive to light.  Neck: No JVD present. Carotid bruit is not present. No edema present. No thyroid mass and no thyromegaly present.  Cardiovascular: S1 normal and S2 normal.  Exam reveals no gallop.   Murmur heard.  Systolic murmur is present with a grade of 4/6  Pulses:      Dorsalis pedis pulses are 2+ on the right side, and 2+ on the left side.  Respiratory: Accessory muscle usage present. No respiratory  distress. She has decreased breath sounds in the right lower field and the left lower field. She has wheezes in the right middle field, the right lower field, the left middle field and the left lower field. She has no rhonchi. She has no rales.  GI: Soft. Bowel sounds are normal. There is no tenderness.  Musculoskeletal:       Right ankle: She exhibits swelling.       Left ankle: She exhibits swelling.  Lymphadenopathy:    She has no cervical adenopathy.  Neurological: She is alert. No cranial nerve deficit.  Skin: Skin is warm. No rash noted. Nails show no clubbing.  Psychiatric: She has a normal mood and affect.   Data Reviewed: Basic Metabolic Panel:  Recent Labs Lab 11/21/14 0229  NA 144  K 4.0  CL 101  CO2 32  GLUCOSE 135*  BUN 48*  CREATININE 1.47*  CALCIUM 8.9   Liver Function Tests:  Recent Labs Lab 11/21/14 0229  AST 19  ALT 13*  ALKPHOS 101  BILITOT 0.5  PROT 6.8  ALBUMIN 3.5   CBC:  Recent Labs Lab 11/21/14 0229  WBC 5.6  HGB 9.7*  HCT 29.0*  MCV 93.3  PLT 254   Studies: Dg Chest Port 1 View  11/21/2014   CLINICAL DATA:  Increasing dyspnea over the past few days.  EXAM: PORTABLE CHEST - 1 VIEW  COMPARISON:  09/06/2014  FINDINGS: There is confluent airspace opacity in the central left lung consistent  with infectious infiltrate. This is new/worsened from 09/06/2014. Chronic unchanged linear scarring is present in the right lung. No large effusions are evident.  IMPRESSION: Left lung infiltrate, likely infectious.   Electronically Signed   By: Ellery Plunk M.D.   On: 11/21/2014 02:49    Scheduled Meds: . allopurinol  100 mg Oral Daily  . amiodarone  200 mg Oral Daily  . amLODipine  5 mg Oral BID  . [START ON 11/22/2014] azithromycin  500 mg Intravenous Q24H  . cefTRIAXone (ROCEPHIN)  IV  1 g Intravenous Q24H  . methylPREDNISolone (SOLU-MEDROL) injection  60 mg Intravenous 3 times per day  . metoprolol succinate  25 mg Oral Daily  .  pantoprazole  40 mg Oral Daily  . sodium chloride  3 mL Intravenous Q12H  . [START ON 11/23/2014] warfarin  5 mg Oral Once per day on Tue   And  . warfarin  2.5 mg Oral Once per day on Sun Mon Wed Thu Fri Sat  . Warfarin - Physician Dosing Inpatient   Does not apply q1800   Continuous Infusions:   Assessment/Plan:  1. Pneumonia left lower lobe. Patient started on Rocephin and Zithromax. White blood cell count normal range. 2. Chronic respiratory failure with pulmonary fibrosis. COPD exacerbation. Continue oxygen supplementation 3 L nasal cannula. Continue IV Solu-Medrol and nebulizer treatments. 3. Essential hypertension continue usual medications. 4. Atrial fibrillation- rate controlled with metoprolol and amiodarone, anticoagulated with warfarin. INR within the therapeutic range. 5. Gastroesophageal reflux disease without esophagitis on Protonix at home. 6. Acute renal insufficiency hold Lasix at this time. Check creatinine in a.m.  Code Status:     Code Status Orders        Start     Ordered   11/21/14 0520  Do not attempt resuscitation (DNR)   Continuous    Question Answer Comment  In the event of cardiac or respiratory ARREST Do not call a "code blue"   In the event of cardiac or respiratory ARREST Do not perform Intubation, CPR, defibrillation or ACLS   In the event of cardiac or respiratory ARREST Use medication by any route, position, wound care, and other measures to relive pain and suffering. May use oxygen, suction and manual treatment of airway obstruction as needed for comfort.      11/21/14 0520     Family Communication: Family at bedside. Disposition Plan: Likely home  Time spent: 35 minutes  Alford Highland  [facility] Hospitalists

## 2014-11-21 NOTE — Progress Notes (Signed)
Physical Therapy Evaluation Patient Details Name: Stefanie Bryan MRN: [id number] DOB: 1928/01/28 Today's Date: 11/21/2014   History of Present Illness  Pt. is an 81 year old female admitted to [facility] with 3 days of worsening shortness of breath and mild cough.  Clinical Impression  Patient will benefit from formal PT while at [facility] to address functional tasks, ambulation, and safety with mobility/exercise. Patient's O2 sats did decrease with 20 feet of walking today as well as with transfer from sit to supine at the end of treatment and did require pursed lip breathing for her O2 sats to return to 90%. Patient did use a rolling walker for safety today, but does normally ambulate without an assistive device; she will benefit from progressing her ambulation/activity tolerance while at [facility], which was decreased due to a decrease in her O2 sats during her PT evaluation.     Follow Up Recommendations Home health PT    Equipment Recommendations       Recommendations for Other Services       Precautions / Restrictions Precautions Precautions: Fall Restrictions Other Position/Activity Restrictions: Oxygen; 3L      Mobility  Bed Mobility Overal bed mobility: Independent             General bed mobility comments: Pt.'s O2 sats did decrease to 83% from 90% when returning back to bed; pt'.s O2 sats did improved to 90% after 50 seconds of pursed lip breathing.   Transfers Overall transfer level: Modified independent Equipment used: Rolling walker (2 wheeled) Transfers: Sit to/from Stand Sit to Stand: Min guard (for safety )         General transfer comment: Pt. was observed to move quickly but safely; CGA assist for safety only; no loss of balance noted.  Ambulation/Gait Ambulation/Gait assistance: Min guard Ambulation Distance (Feet): 20 Feet Assistive device: Rolling walker (2 wheeled)       General Gait Details: O2 sats did decrease from 90% prior to walking to 83%  after walking 20 feet; O2 sats did increase back to 90% after 1 minute and 30 seconds of pursed lip breathing at the edge of the bed.   Stairs            Wheelchair Mobility    Modified Rankin (Stroke Patients Only)       Balance Overall balance assessment: No apparent balance deficits (not formally assessed) (Pt. was safe with mobility taks, but did move quickly. )                                           Pertinent Vitals/Pain Pain Assessment: No/denies pain    Home Living Family/patient expects to be discharged to:: Private residence Living Arrangements: Other (Comment) (Pt. has granddaughter that lives with her. ) Available Help at Discharge: Family (Granddaughter) Type of Home: Apartment         Home Equipment: Environmental consultant - 2 wheels;Cane - quad;Bedside commode Additional Comments: Pt. has equipment available at home, but is currently I without use of assistive device.     Prior Function Level of Independence: Independent               Hand Dominance        Extremity/Trunk Assessment               Lower Extremity Assessment: Overall WFL for tasks assessed  Communication   Communication: No difficulties  Cognition Arousal/Alertness: Awake/alert Behavior During Therapy: WFL for tasks assessed/performed Overall Cognitive Status: Within Functional Limits for tasks assessed                      General Comments      Exercises        Assessment/Plan    PT Assessment Patient needs continued PT services  PT Diagnosis Generalized weakness   PT Problem List Decreased activity tolerance;Cardiopulmonary status limiting activity  PT Treatment Interventions Functional mobility training;Therapeutic activities;Therapeutic exercise (safety with mobility/exercise)   PT Goals (Current goals can be found in the Care Plan section) Acute Rehab PT Goals Patient Stated Goal: "To go home" PT Goal Formulation: With  patient Time For Goal Achievement: 12/05/14 Potential to Achieve Goals: Good    Frequency Min 2X/week   Barriers to discharge        Co-evaluation               End of Session Equipment Utilized During Treatment: Gait belt;Oxygen (3L O2) Activity Tolerance: Treatment limited secondary to medical complications (Comment) (ambulation was decreased due to O2 sats dropping to 83%) Patient left: in bed;with bed alarm set           Time: 1215-1230 PT Time Calculation (min) (ACUTE ONLY): 15 min   Charges:   PT Evaluation $Initial PT Evaluation Tier I: 1 Procedure     PT G CodesKatherina Right Ayme Short, PT 11/21/2014, 1:00 PM

## 2014-11-22 LAB — BASIC METABOLIC PANEL
ANION GAP: 7 (ref 5–15)
BUN: 47 mg/dL — ABNORMAL HIGH (ref 6–20)
CHLORIDE: 102 mmol/L (ref 101–111)
CO2: 34 mmol/L — AB (ref 22–32)
Calcium: 8.7 mg/dL — ABNORMAL LOW (ref 8.9–10.3)
Creatinine, Ser: 1.35 mg/dL — ABNORMAL HIGH (ref 0.44–1.00)
GFR calc Af Amer: 41 mL/min — ABNORMAL LOW (ref >60)
GFR calc non Af Amer: 35 mL/min — ABNORMAL LOW (ref >60)
Glucose, Bld: 173 mg/dL — ABNORMAL HIGH (ref 65–99)
POTASSIUM: 4.7 mmol/L (ref 3.5–5.1)
SODIUM: 143 mmol/L (ref 135–145)

## 2014-11-22 LAB — PROTIME-INR
INR: 1.87
Prothrombin Time: 21.7 seconds — ABNORMAL HIGH (ref 11.4–15.0)

## 2014-11-22 MED ORDER — WARFARIN SODIUM 2.5 MG PO TABS: 5.0000 mg | ORAL_TABLET | Freq: Once | ORAL | Status: DC

## 2014-11-22 MED ORDER — WARFARIN - PHARMACIST DOSING INPATIENT
Freq: Every day | Status: DC
  Administered 2014-11-23: 1

## 2014-11-22 MED ORDER — WARFARIN SODIUM 2.5 MG PO TABS: 2.5000 mg | ORAL_TABLET | Freq: Every day | ORAL | Status: DC

## 2014-11-22 MED ORDER — WARFARIN SODIUM 2.5 MG PO TABS
5.0000 mg | ORAL_TABLET | Freq: Once | ORAL | Status: AC
  Administered 2014-11-22: 5 mg via ORAL
  Filled 2014-11-22: qty 2

## 2014-11-22 NOTE — Clinical Documentation Improvement (Signed)
Component     Latest Ref Rng 11/21/2014 11/22/2014  BUN     6 - 20 mg/dL 48 (H) 47 (H)  Creatinine     0.44 - 1.00 mg/dL 1.47 (H) 1.35 (H)  EGFR (Non-African Amer.)     >60 mL/min 32 (L) 35 (L)   Noted pt. Has bilateral ankle edema documented in progress notes 11/22/2014. Assessment  Note on 06/12 and 11/22/2014  :"Acute renal insufficiency - creatinine slightly better this morning. will hold Lasix again today" Output 0 ml on 11/21/2014. Pt. not receiving IVF. Lasix on hold.  Please indicate if abnormal lab has significant diagnosis.    Other Not able to determine  Risk Factors: HTN,     Thank you,  Philippa Chester ,RN Clinical Documentation Specialist:  [place] [employer]

## 2014-11-22 NOTE — Progress Notes (Signed)
Pt cont on IV antibiotics. Denies pain on assessment. Ambulates w/ standby assist. Tele in place. Rested quietly during evening. No acute distress, will cont to monitor.

## 2014-11-22 NOTE — Care Management Note (Addendum)
Case Management Note  Patient Details  Name: Stefanie Bryan MRN: [id number] Date of Birth: 07/31/1927  Subjective/Objective:                  Met with patient to discuss discharge planning. She states she is usually independent at home. She did however state that "her 5 children have taken her car away so that she cannot drive". She states her granddaughter (79 y/o) lives with her and provides assistance with meals. She states she was started on home O2 through Dr. Silvestre Gunner office about 8 weeks ago and she "just can't shake this illness". She does not know what agency it is through. She states the tanks are too heavy and would like to have the "purse". I explained that I may be able to assist her with that on an outpatient basis but would need to know name of agency; She will ask her daughter. RNCM will need orders for outpatient assessment "portable eval by RT" ([employer]). Her PCP is Dr. Veda Canning. She says she has a rolling walker available to use at home.   Action/Plan: RNCM will continue to follow. List of home health care providers left with patient.   Expected Discharge Date:                  Expected Discharge Plan:     In-House Referral:     Discharge planning Services  CM Consult  Post Acute Care Choice:    Choice offered to:  Patient  DME Arranged:    DME Agency:     HH Arranged:    [place] Agency:     Status of Service:     Medicare Important Message Given:  Yes Date Medicare IM Given:  11/22/14 Medicare IM give by:  Marshell Garfinkel Date Additional Medicare IM Given:    Additional Medicare Important Message give by:     If discussed at [place] of Stay Meetings, dates discussed:    Additional Comments: Patient has used [facility] but they will not take [employer]. Daughter Stefanie Bryan [phone] would like to use [facility]. I have called referral to [facility] with [facility]. I have faxed O2 order to [facility] as they provide home O2 per Stefanie Bryan to this patient  for portable evaluation by RT which can be done as outpatient.   Marshell Garfinkel, RN 11/22/2014, 9:37 AM

## 2014-11-22 NOTE — Progress Notes (Signed)
Physical Therapy Treatment Patient Details Name: Stefanie Bryan MRN: [id number] DOB: [birth year] Today's Date: 11/22/2014    History of Present Illness Pt. is an 81 year old female admitted to [facility] with 3 days of worsening shortness of breath and mild cough.  Pt diagnosed with L lower lobe PNA and COPD exacerbation (CRF with pulmonary fibrosis).    PT Comments    Pt ambulated 65 feet no AD (2 mild loss of balance requiring assist to steady without AD) and O2 sats decreased from 92% at rest to 82% post ambulation on 3 L/min via nasal cannula and pt c/o SOB (O2 increased back to 92% within a few minutes of pursed lip breathing sitting on edge of bed on 3 L/min via nasal cannula).  Pt would benefit from continued PT for balance, activity tolerance, and progressive functional mobility per pt tolerance (monitoring O2 saturations).  Follow Up Recommendations  Home health PT     Equipment Recommendations  Rolling walker with 5" wheels    Recommendations for Other Services       Precautions / Restrictions Precautions Precautions: Fall Precaution Comments: Oxygen (3 L at home) Restrictions Weight Bearing Restrictions: No    Mobility  Bed Mobility Overal bed mobility: Independent                Transfers Overall transfer level: Needs assistance Equipment used: None Transfers: Sit to/from Stand;Stand Pivot Transfers Sit to Stand: Min guard Stand pivot transfers: Min guard       General transfer comment: CGA for safety; no loss of balance noted  Ambulation/Gait Ambulation/Gait assistance: Min guard;Min assist Ambulation Distance (Feet): 65 Feet Assistive device: None       General Gait Details: 2 mild loss of balance requiring assist to steady without AD; O2 sats decreased from 92% at rest to 82% post ambulation on 3 L/min via nasal cannula and pt c/o SOB (O2 increased back to 92% within a few minutes of pursed lip breathing sitting on edge of bed)   Stairs             Wheelchair Mobility    Modified Rankin (Stroke Patients Only)       Balance                                    Cognition Arousal/Alertness: Awake/alert Behavior During Therapy: WFL for tasks assessed/performed Overall Cognitive Status: Within Functional Limits for tasks assessed                      Exercises   Performed semi-supine B LE therapeutic exercise x 10 reps:  Ankle pumps (AROM B LE's); quad sets x3 second holds (AROM B LE's); glute squeezes x3 second holds (AROM B); SAQ's (AROM R; AROM L); heelslides (AROM R; AROM L), hip abd/adduction (AROM R; AROM L).  Pt required vc's and tactile cues for correct technique with exercises.     General Comments  Nursing cleared pt for participation in physical therapy.  Pt agreeable to PT session.      Pertinent Vitals/Pain Pain Assessment: No/denies pain  HR 62-75 bpm during session (see O2 sats in above PT comments section; pt on 3 L/min via nasal cannula throughout PT session).    Home Living                      Prior Function  PT Goals (current goals can now be found in the care plan section) Acute Rehab PT Goals Patient Stated Goal: "To go home" PT Goal Formulation: With patient Time For Goal Achievement: 12/05/14 Potential to Achieve Goals: Good Progress towards PT goals: Progressing toward goals    Frequency  Min 2X/week    PT Plan Current plan remains appropriate    Co-evaluation             End of Session Equipment Utilized During Treatment: Gait belt;Oxygen (3 L O2) Activity Tolerance: Other (comment) (Limited d/t SOB and O2 desaturation with activity) Patient left: in bed;with call bell/phone within reach;with bed alarm set     Time: 1610-9604 PT Time Calculation (min) (ACUTE ONLY): 28 min  Charges:  $Therapeutic Exercise: 23-37 mins                    G CodesHendricks Limes 2014/12/17, 2:42 PM Hendricks Limes, PT [phone]

## 2014-11-22 NOTE — Progress Notes (Signed)
ANTICOAGULATION CONSULT NOTE - Initial Consult  Pharmacy Consult for Coumadin Indication: atrial fibrillation  Allergies  Allergen Reactions  . Sulfa Antibiotics     Patient Measurements: Height: 5\' 3"  (160 cm) Weight: 167 lb 3.2 oz (75.841 kg) IBW/kg (Calculated) : 52.4   Vital Signs: Temp: 98.2 F (36.8 C) (06/13 1122) Temp Source: Oral (06/13 1122) BP: 117/41 mmHg (06/13 1122) Pulse Rate: 55 (06/13 1122)  Labs:  Recent Labs  11/21/14 0229 11/22/14 0315  HGB 9.7*  --   HCT 29.0*  --   PLT 254  --   LABPROT 23.4* 21.7*  INR 2.06 1.87  CREATININE 1.47* 1.35*  TROPONINI <0.03  --     Estimated Creatinine Clearance: 30.3 mL/min (by C-G formula based on Cr of 1.35).   Medical History: Past Medical History  Diagnosis Date  . COPD (chronic obstructive pulmonary disease)   . CHF (congestive heart failure)   . Hypertension   . Atrial fibrillation   . GERD (gastroesophageal reflux disease)       Assessment: 81 yo female on warfarin PTA for AFib here for CAP Home warfarin dose of 5 mg on Tuesdays, 2.5 mg all other days Also on azithromycin and home amiodarone  6/12: INR 2.06 - warfarin 2.5 mg 6/13: INR 1.87  Goal of Therapy:  INR 2-3 Monitor platelets by anticoagulation protocol: Yes   Plan:  Will increase today's dose to warfarin 5 mg PO tonight Placeholder orders in place for 5 mg tomorrow then 2.5 mg daily thereafter, may need to decrease dose tomorrow based on INR in AM INR ordered for AM    Crist Fat, PharmD, BCPS Clinical Pharmacist 11/22/2014,11:32 AM

## 2014-11-22 NOTE — Progress Notes (Signed)
Patient ID: Stefanie Bryan, female   DOB: January 25, 1928, 81 y.o.   MRN: [id number] [facility] PROGRESS NOTE  PCP: Hillery Aldo, MD  HPI/Subjective: Patient feels short of breath with limited activity. Feels okay wall just sitting in bed. Not much cough or wheeze.  Objective: Filed Vitals:   11/22/14 1025  BP: 121/55  Pulse: 60  Temp:   Resp:     Intake/Output Summary (Last 24 hours) at 11/22/14 1119 Last data filed at 11/22/14 1038  Gross per 24 hour  Intake    483 ml  Output   1520 ml  Net  -1037 ml   Filed Weights   11/21/14 0114 11/21/14 0513 11/22/14 0553  Weight: 69.854 kg (154 lb) 68.8 kg (151 lb 10.8 oz) 75.841 kg (167 lb 3.2 oz)    ROS: Review of Systems  Constitutional: Negative for fever and chills.  Eyes: Negative for blurred vision.  Respiratory: Positive for shortness of breath. Negative for cough, hemoptysis and sputum production.   Cardiovascular: Negative for chest pain and palpitations.  Gastrointestinal: Negative for nausea, vomiting, abdominal pain, diarrhea and constipation.  Genitourinary: Negative for dysuria.  Musculoskeletal: Negative for joint pain.  Neurological: Negative for dizziness and headaches.   Exam: Physical Exam  HENT:  Nose: No mucosal edema.  Mouth/Throat: No oropharyngeal exudate or posterior oropharyngeal edema.  Eyes: Conjunctivae, EOM and lids are normal. Pupils are equal, round, and reactive to light.  Neck: No JVD present. Carotid bruit is not present. No edema present. No thyroid mass and no thyromegaly present.  Cardiovascular: S1 normal and S2 normal.  Exam reveals no gallop.   Murmur heard.  Systolic murmur is present with a grade of 4/6  Pulses:      Dorsalis pedis pulses are 2+ on the right side, and 2+ on the left side.  Respiratory: No accessory muscle usage. No respiratory distress. She has decreased breath sounds in the right middle field, the right lower field, the left middle field and the  left lower field. She has no wheezes. She has rhonchi in the right lower field and the left lower field. She has no rales.  GI: Soft. Bowel sounds are normal. There is no tenderness.  Musculoskeletal:       Right ankle: She exhibits swelling.       Left ankle: She exhibits swelling.  Lymphadenopathy:    She has no cervical adenopathy.  Neurological: She is alert. No cranial nerve deficit.  Skin: Skin is warm. No rash noted. Nails show no clubbing.  Psychiatric: She has a normal mood and affect.   Data Reviewed: Basic Metabolic Panel:  Recent Labs Lab 11/21/14 0229 11/22/14 0315  NA 144 143  K 4.0 4.7  CL 101 102  CO2 32 34*  GLUCOSE 135* 173*  BUN 48* 47*  CREATININE 1.47* 1.35*  CALCIUM 8.9 8.7*   Liver Function Tests:  Recent Labs Lab 11/21/14 0229  AST 19  ALT 13*  ALKPHOS 101  BILITOT 0.5  PROT 6.8  ALBUMIN 3.5   CBC:  Recent Labs Lab 11/21/14 0229  WBC 5.6  HGB 9.7*  HCT 29.0*  MCV 93.3  PLT 254   Studies: Dg Chest Port 1 View  11/21/2014   CLINICAL DATA:  Increasing dyspnea over the past few days.  EXAM: PORTABLE CHEST - 1 VIEW  COMPARISON:  09/06/2014  FINDINGS: There is confluent airspace opacity in the central left lung consistent with infectious infiltrate. This is new/worsened from 09/06/2014. Chronic unchanged linear  scarring is present in the right lung. No large effusions are evident.  IMPRESSION: Left lung infiltrate, likely infectious.   Electronically Signed   By: Ellery Plunk M.D.   On: 11/21/2014 02:49    Scheduled Meds: . allopurinol  100 mg Oral Daily  . amiodarone  200 mg Oral Daily  . amLODipine  5 mg Oral BID  . azithromycin  500 mg Intravenous Q24H  . cefTRIAXone (ROCEPHIN)  IV  1 g Intravenous Q24H  . methylPREDNISolone (SOLU-MEDROL) injection  60 mg Intravenous 3 times per day  . metoprolol succinate  25 mg Oral Daily  . pantoprazole  40 mg Oral Daily  . sodium chloride  3 mL Intravenous Q12H  . Warfarin - Physician  Dosing Inpatient   Does not apply q1800   Continuous Infusions:   Assessment/Plan:  1. Pneumonia left lower lobe. Patient started on Rocephin and Zithromax. White blood cell count normal range. 2. Chronic respiratory failure with pulmonary fibrosis. COPD exacerbation. Continue oxygen supplementation 3 L nasal cannula. Continue IV Solu-Medrol and nebulizer treatments. Patient sees Dr. Meredeth Ide as outpatient I will put in a consult for him. 3. Essential hypertension continue usual medications. 4. Atrial fibrillation- rate controlled with metoprolol and amiodarone, anticoagulated with warfarin. INR slightly lower this morning, will give 5 mg of Coumadin today and recheck INR tomorrow. 5. Gastroesophageal reflux disease without esophagitis on Protonix at home. 6. Acute renal insufficiency - creatinine slightly better this morning. I will hold Lasix again today.  Code Status:     Code Status Orders        Start     Ordered   11/21/14 0520  Do not attempt resuscitation (DNR)   Continuous    Question Answer Comment  In the event of cardiac or respiratory ARREST Do not call a "code blue"   In the event of cardiac or respiratory ARREST Do not perform Intubation, CPR, defibrillation or ACLS   In the event of cardiac or respiratory ARREST Use medication by any route, position, wound care, and other measures to relive pain and suffering. May use oxygen, suction and manual treatment of airway obstruction as needed for comfort.      11/21/14 0520     Family Communication: Family yesterday. Disposition Plan: Likely home  Time spent: 25 minutes  Alford Highland  [facility] Hospitalists

## 2014-11-23 LAB — PROTIME-INR
INR: 1.49
PROTHROMBIN TIME: 18.2 s — AB (ref 11.4–15.0)

## 2014-11-23 MED ORDER — WARFARIN SODIUM 7.5 MG PO TABS
7.5000 mg | ORAL_TABLET | Freq: Once | ORAL | Status: AC
  Administered 2014-11-23: 7.5 mg via ORAL
  Filled 2014-11-23: qty 1

## 2014-11-23 MED ORDER — AZITHROMYCIN 250 MG PO TABS
500.0000 mg | ORAL_TABLET | Freq: Every day | ORAL | Status: DC
  Administered 2014-11-24: 500 mg via ORAL
  Filled 2014-11-23: qty 2

## 2014-11-23 MED ORDER — METHYLPREDNISOLONE SODIUM SUCC 40 MG IJ SOLR
40.0000 mg | Freq: Three times a day (TID) | INTRAMUSCULAR | Status: DC
  Administered 2014-11-23 – 2014-11-24 (×3): 40 mg via INTRAVENOUS
  Filled 2014-11-23 (×3): qty 1

## 2014-11-23 NOTE — Progress Notes (Signed)
Physical Therapy Treatment Patient Details Name: Stefanie Bryan MRN: [id number] DOB: [birth year] Today's Date: 11/23/2014    History of Present Illness Pt. is an 81 year old female admitted to [facility] with 3 days of worsening shortness of breath and mild cough.  Pt diagnosed with L lower lobe PNA and COPD exacerbation (CRF with pulmonary fibrosis).    PT Comments    Pt's O2 (on 3 L/min via nasal cannula) decreased to 82% walking to bathroom and back and also decreased to 79% walking 65 feet (even with consistent cueing for breathing techniques).  Increased O2 to 4 L/min via nasal cannula and pt's O2 decreased to 84% after 65 feet of ambulation.  Pt demonstrating increasing SOB with distance and requiring a few minutes sitting rest to decrease SOB and also increase O2 saturations >91%.  Pt with 1 loss of balance requiring assist to steady with ambulation but anticipate with use of RW (pt has one at home and has previous practice with RW), pt will be able to discharge home safely when medically appropriate.  Follow Up Recommendations  Home health PT     Equipment Recommendations   (Pt reports already having RW)    Recommendations for Other Services       Precautions / Restrictions Precautions Precautions: Fall Precaution Comments: Oxygen (3 L at home) Restrictions Weight Bearing Restrictions: No    Mobility  Bed Mobility                  Transfers Overall transfer level: Needs assistance Equipment used: None Transfers: Sit to/from Stand;Stand Pivot Transfers Sit to Stand: Min guard Stand pivot transfers: Min guard Agricultural consultant transfer)       General transfer comment: assist for O2 tubing  Ambulation/Gait Ambulation/Gait assistance: Min guard;Min assist Ambulation Distance (Feet):  (15 feet x2 (to bathroom and back); 65 feet x2) Assistive device: None       General Gait Details: 1 mild loss of balance requiring assist to steady when turning; 1st ambulation to  bathroom and back pt's O2 decreased to 82% on 3 L/min via nasal cannula; 2nd ambulation 65 feet pt's O2 decreased to79% on 3 L/min via nasal cannula, HR 84 bpm; 3rd ambulation pt's O2 decreased to 84% on 4 L/min via nasal cannula, HR 78 bpm.   Stairs            Wheelchair Mobility    Modified Rankin (Stroke Patients Only)       Balance                                    Cognition Arousal/Alertness: Awake/alert Behavior During Therapy: WFL for tasks assessed/performed Overall Cognitive Status: Within Functional Limits for tasks assessed                      Exercises      General Comments  Nursing cleared pt for participation in physical therapy.  Pt agreeable to PT session.      Pertinent Vitals/Pain Pain Assessment: No/denies pain  At rest pt's O2 94% on 3 L/min via nasal cannula and HR 68 bpm (see above ambulation for vitals with gait).    Home Living                      Prior Function            PT Goals (current goals can now  be found in the care plan section) Acute Rehab PT Goals Patient Stated Goal: "To go home" PT Goal Formulation: With patient Time For Goal Achievement: 12/05/14 Potential to Achieve Goals: Good Progress towards PT goals: Progressing toward goals    Frequency  Min 2X/week    PT Plan Current plan remains appropriate    Co-evaluation             End of Session Equipment Utilized During Treatment: Gait belt;Oxygen (3 L 02) Activity Tolerance: Other (comment) (Limited d/t SOB and O2 desaturation with activity) Patient left: in chair;with call bell/phone within reach;with nursing/sitter in room (Nursing cleared pt to not have chair alarm on.)     Time: 1404-1430 PT Time Calculation (min) (ACUTE ONLY): 26 min  Charges:  $Therapeutic Exercise: 23-37 mins                    G CodesHendricks Limes 2014-12-14, 2:54 PM  Hendricks Limes, PT [phone]

## 2014-11-23 NOTE — Discharge Planning (Signed)
SATURATION QUALIFICATIONS: (This note is used to comply with regulatory documentation for home oxygen)  Patient Saturations on Room Air at Rest = 80%  Patient Saturations on Room Air while Ambulating = 72%  Patient Saturations on 4 Liters of oxygen while Ambulating = 84%  Please briefly explain why patient needs home oxygen:

## 2014-11-23 NOTE — Progress Notes (Signed)
Patient ID: Stefanie Bryan, female   DOB: 03/23/1928, 81 y.o.   MRN: [id number] [facility] PROGRESS NOTE  PCP: Hillery Aldo, MD  HPI/Subjective: Patient feeling a little bit better today. She sat in the chair a lot yesterday and she feels better with her breathing when she sitting up. Breathing a little bit better today, some cough but nonproductive.  Objective: Filed Vitals:   11/23/14 0739  BP: 106/58  Pulse: 62  Temp: 97.8 F (36.6 C)  Resp: 18    Intake/Output Summary (Last 24 hours) at 11/23/14 0955 Last data filed at 11/23/14 0943  Gross per 24 hour  Intake    603 ml  Output   2025 ml  Net  -1422 ml   Filed Weights   11/21/14 0513 11/22/14 0553 11/23/14 0500  Weight: 68.8 kg (151 lb 10.8 oz) 75.841 kg (167 lb 3.2 oz) 75.779 kg (167 lb 1 oz)    ROS: Review of Systems  Constitutional: Negative for fever and chills.  Eyes: Negative for blurred vision.  Respiratory: Positive for cough and shortness of breath. Negative for hemoptysis and sputum production.   Cardiovascular: Negative for chest pain and palpitations.  Gastrointestinal: Negative for nausea, vomiting, abdominal pain, diarrhea and constipation.  Genitourinary: Negative for dysuria.  Musculoskeletal: Negative for joint pain.  Neurological: Negative for dizziness and headaches.   Exam: Physical Exam  HENT:  Nose: No mucosal edema.  Mouth/Throat: No oropharyngeal exudate or posterior oropharyngeal edema.  Eyes: Conjunctivae, EOM and lids are normal. Pupils are equal, round, and reactive to light.  Neck: No JVD present. Carotid bruit is not present. No edema present. No thyroid mass and no thyromegaly present.  Cardiovascular: S1 normal and S2 normal.  Exam reveals no gallop.   Murmur heard.  Systolic murmur is present with a grade of 4/6  Pulses:      Dorsalis pedis pulses are 2+ on the right side, and 2+ on the left side.  Respiratory: No accessory muscle usage. No respiratory  distress. She has decreased breath sounds in the right middle field, the right lower field, the left middle field and the left lower field. She has wheezes in the right middle field, the right lower field, the left middle field and the left lower field. She has no rhonchi. She has no rales.  GI: Soft. Bowel sounds are normal. There is no tenderness.  Musculoskeletal:       Right ankle: She exhibits swelling.       Left ankle: She exhibits swelling.  Lymphadenopathy:    She has no cervical adenopathy.  Neurological: She is alert. No cranial nerve deficit.  Skin: Skin is warm. No rash noted. Nails show no clubbing.  Psychiatric: She has a normal mood and affect.   Data Reviewed: Basic Metabolic Panel:  Recent Labs Lab 11/21/14 0229 11/22/14 0315  NA 144 143  K 4.0 4.7  CL 101 102  CO2 32 34*  GLUCOSE 135* 173*  BUN 48* 47*  CREATININE 1.47* 1.35*  CALCIUM 8.9 8.7*   Liver Function Tests:  Recent Labs Lab 11/21/14 0229  AST 19  ALT 13*  ALKPHOS 101  BILITOT 0.5  PROT 6.8  ALBUMIN 3.5   CBC:  Recent Labs Lab 11/21/14 0229  WBC 5.6  HGB 9.7*  HCT 29.0*  MCV 93.3  PLT 254    Scheduled Meds: . allopurinol  100 mg Oral Daily  . amiodarone  200 mg Oral Daily  . amLODipine  5 mg Oral  BID  . azithromycin  500 mg Intravenous Q24H  . cefTRIAXone (ROCEPHIN)  IV  1 g Intravenous Q24H  . methylPREDNISolone (SOLU-MEDROL) injection  40 mg Intravenous 3 times per day  . metoprolol succinate  25 mg Oral Daily  . pantoprazole  40 mg Oral Daily  . sodium chloride  3 mL Intravenous Q12H  . [START ON 11/24/2014] warfarin  2.5 mg Oral q1800  . warfarin  7.5 mg Oral ONCE-1800  . Warfarin - Pharmacist Dosing Inpatient   Does not apply q1800   Continuous Infusions:   Assessment/Plan:  1. Pneumonia left lower lobe. Patient started on Rocephin and Zithromax. White blood cell count normal range. 2. Chronic respiratory failure with pulmonary fibrosis. COPD exacerbation. Continue  oxygen supplementation 3 L nasal cannula. Taper Solu-Medrol to 40 mg IV every 6 hours and nebulizer treatments. Patient follows with Dr. Meredeth Ide as outpatient. Potential discharge in 1-2 days depending on her breathing. 3. Essential hypertension continue usual medications. 4. Atrial fibrillation- rate controlled with metoprolol and amiodarone, anticoagulated with warfarin. INR down to 1.49, will give 7.5 mg of Coumadin today and recheck INR tomorrow. 5. Gastroesophageal reflux disease without esophagitis on Protonix at home. 6. Chronic kidney disease stage III, looking back at old labs are creatinine has varied between 1.2 and 1.8.  Code Status:     Code Status Orders        Start     Ordered   11/21/14 0520  Do not attempt resuscitation (DNR)   Continuous    Question Answer Comment  In the event of cardiac or respiratory ARREST Do not call a "code blue"   In the event of cardiac or respiratory ARREST Do not perform Intubation, CPR, defibrillation or ACLS   In the event of cardiac or respiratory ARREST Use medication by any route, position, wound care, and other measures to relive pain and suffering. May use oxygen, suction and manual treatment of airway obstruction as needed for comfort.      11/21/14 0520     Disposition Plan: Likely home  Time spent: 20 minutes  Alford Highland  [facility]

## 2014-11-23 NOTE — Progress Notes (Addendum)
Date: 11/23/2014,   MRN# [id number] Stefanie Bryan August 14, 1927 Code Status:     Code Status Orders        Start     Ordered   11/21/14 0520  Do not attempt resuscitation (DNR)   Continuous    Question Answer Comment  In the event of cardiac or respiratory ARREST Do not call a "code blue"   In the event of cardiac or respiratory ARREST Do not perform Intubation, CPR, defibrillation or ACLS   In the event of cardiac or respiratory ARREST Use medication by any route, position, wound care, and other measures to relive pain and suffering. May use oxygen, suction and manual treatment of airway obstruction as needed for comfort.      11/21/14 0520     Hosp day:@LENGTHOFSTAYDAYS @ Referring MD: @     PCP:      AdmissionWeight: 154 lb (69.854 kg)                 CurrentWeight: 167 lb 1 oz (75.779 kg)  CC: shortness of breath, low sats, pulm fibrosis  HPI: tHIS IS AN 81 YR old lady here with worsening sob. Sats were low. Noted to have left lower lobe pneumonia in the setting of known pulmonary fibrosis and copd exacerbation. Tonight still quite dyspneic. No chest pain, leg edema, calf pain, recent travel, pleurisy, fever, chills or sweats.  She has a cough, no significant sputum production She is on chronic warfarin for atrial fib.   PMHX:   Past Medical History  Diagnosis Date  . COPD (chronic obstructive pulmonary disease)   . CHF (congestive heart failure)   . Hypertension   . Atrial fibrillation   . GERD (gastroesophageal reflux disease)    Surgical Hx:  Past Surgical History  Procedure Laterality Date  . Abdominal hysterectomy    . Tubal ligation    . Cholecystectomy    . Knee surgery      right and left   Family Hx:  Family History  Problem Relation Age of Onset  . Diverticulitis Brother    Social Hx:   History  Substance Use Topics  . Smoking status: Never Smoker   . Smokeless tobacco: Never Used  . Alcohol Use: No   Medication:    Home  Medication:  No current outpatient prescriptions on file.  Current Medication: @   Allergies:  Sulfa antibiotics  Review of Systems: Gen:  Denies  fever, sweats, chills HEENT: Denies blurred vision, double vision, ear pain, eye pain, hearing loss, nose bleeds, sore throat Cvc:  No dizziness, chest pain or heaviness Resp: still dyspneic with minimum activity, no pleurisy, hemoptysis   Gi: Denies swallowing difficulty, stomach pain, nausea or vomiting, diarrhea, constipation, bowel incontinence Gu:  Denies bladder incontinence, burning urine Ext:   No Joint pain, stiffness or swelling Skin: No skin rash, easy bruising or bleeding or hives Endoc:  No polyuria, polydipsia , polyphagia or weight change Psych: No depression, insomnia or hallucinations  Other:  All other systems negative  Physical Examination:   VS: BP 110/40 mmHg  Pulse 58  Temp(Src) 98.1 F (36.7 C) (Oral)  Resp 18  Ht  (1.6 m)  Wt 167 lb 1 oz (75.779 kg)  BMI 29.60 kg/m2  SpO2 95%  General Appearance: sitting in chair, 3 liters  on, speaking in full sentences  Neuropsych without focal findings, mental status, speech normal, alert and oriented, cranial nerves 2-12 intact, reflexes normal and symmetric, sensation grossly normal  HEENT:  PERRLA, EOM intact, no ptosis, no other lesions noticed Pulmonary:.No wheezing, No rales  Basal crackles   Cardiovascular:  Normal S1,S2.  No m/r/g.  Abdominal aorta pulsation normal.    Abdomen:Benign, Soft, non-tender, No masses, hepatosplenomegaly, No lymphadenopathy Endoc: No evident thyromegaly, no signs of acromegaly or Cushing features Skin:   warm, no rashes, no ecchymosis  Extremities: normal, no cyanosis, clubbing, no edema, warm with normal capillary refill. Other findings:   Labs results:   Recent Labs     11/21/14  0229  11/22/14  0315  11/23/14  0420  HGB  9.7*   --    --   HCT  29.0*   --    --   MCV  93.3   --    --   WBC  5.6   --    --    BUN  48*  47*   --   CREATININE  1.47*  1.35*   --   GLUCOSE  135*  173*   --   CALCIUM  8.9  8.7*   --   INR  2.06  1.87  1.49  ,    Rad results:  EXAM: PORTABLE CHEST - 1 VIEW  COMPARISON: 09/06/2014  FINDINGS: There is confluent airspace opacity in the central left lung consistent with infectious infiltrate. This is new/worsened from 09/06/2014. Chronic unchanged linear scarring is present in the right lung. No large effusions are evident.  IMPRESSION: Left lung infiltrate, likely infectious    Assessment and Plan: 1. Left lower lung pneumonia, on rocephin and zithromax -continue same antibiotic  2. Pulmonary fibrosis, scleroderma, doubt amiodarone toxicity at this time -Continue to observe -serial dlco/lung volumes -out patient follow up  3. Copd exacerbation -oxygen at 3 liters, 4 liters with activity -duo neb, solumedrol   I have personally obtained a history, examined the patient, evaluated laboratory and imaging results, formulated the assessment and plan and placed orders.  The Patient requires high complexity decision making for assessment and support, frequent evaluation and titration of therapies, application of advanced monitoring technologies and extensive interpretation of multiple databases.   Stefanie Bryan,M.D. Pulmonary & Critical care Medicine [facility]

## 2014-11-23 NOTE — Progress Notes (Signed)
ANTICOAGULATION CONSULT NOTE - Follow up  Pharmacy Consult for Coumadin Indication: atrial fibrillation  Allergies  Allergen Reactions  . Sulfa Antibiotics     Patient Measurements: Height: 5\' 3"  (160 cm) Weight: 167 lb 1 oz (75.779 kg) IBW/kg (Calculated) : 52.4   Vital Signs: Temp: 97.8 F (36.6 C) (06/14 0739) Temp Source: Oral (06/14 0739) BP: 106/58 mmHg (06/14 0739) Pulse Rate: 62 (06/14 0739)  Labs:  Recent Labs  11/21/14 0229 11/22/14 0315 11/23/14 0420  HGB 9.7*  --   --   HCT 29.0*  --   --   PLT 254  --   --   LABPROT 23.4* 21.7* 18.2*  INR 2.06 1.87 1.49  CREATININE 1.47* 1.35*  --   TROPONINI <0.03  --   --     Estimated Creatinine Clearance: 30.3 mL/min (by C-G formula based on Cr of 1.35).   Medical History: Past Medical History  Diagnosis Date  . COPD (chronic obstructive pulmonary disease)   . CHF (congestive heart failure)   . Hypertension   . Atrial fibrillation   . GERD (gastroesophageal reflux disease)       Assessment: 81 yo female on warfarin PTA for AFib here for CAP Home warfarin dose of 5 mg on Tuesdays, 2.5 mg all other days Also on azithromycin (started this admission) and home amiodarone  6/12: INR 2.06 - warfarin 2.5 mg 6/13: INR 1.87 - warfarin 5 mg 6/14: INR 1.49 - INR decreasing despite increase in dose and possible drug-drug interaction with azithromycin  Goal of Therapy:  INR 2-3 Monitor platelets by anticoagulation protocol: Yes   Plan:  MD has increased today's dose to 7.5 mg PO x1 for tonight - discussed with MD that dose is about 2x pt's average daily home dose of about 3 mg. Per MD, would like higher INR than lower INR at this time.  Of note, placeholder order in place for warfarin 2.5 mg daily starting tomorrow  Will need to closely monitor INR with recent higher doses - INR ordered for AM If pt is discharged tomorrow, will need close INR follow up 1-2 days after discharge since pt received recent higher  doses of warfarin that may take a couple days to see full effect of doses.  Per MD, will hold off on enoxaparin for prophylaxis at this time    Crist Fat, PharmD, BCPS Clinical Pharmacist 11/23/2014,1:14 PM

## 2014-11-23 NOTE — Progress Notes (Signed)
Cont on IV antibiotics. Denies pain on assessment. Ambulates w/ standby assist. No c/o distress noted. Rested quietly during evening. Cont O2 at 3L. No acute distress, will cont to monitor.

## 2014-11-23 NOTE — Progress Notes (Signed)
PHARMACIST - PHYSICIAN COMMUNICATION CONCERNING: Antibiotic IV to Oral Route Change Policy  RECOMMENDATION: This patient is receiving azithromycin by the intravenous route.  Based on criteria approved by the Pharmacy and Therapeutics Committee, the antibiotic(s) is/are being converted to the equivalent oral dose form(s).   DESCRIPTION: These criteria include:  Patient being treated for a respiratory tract infection, urinary tract infection, cellulitis or clostridium difficile associated diarrhea if on metronidazole  The patient is not neutropenic and does not exhibit a GI malabsorption state  The patient is eating (either orally or via tube) and/or has been taking other orally administered medications for a least 24 hours  The patient is improving clinically and has a Tmax < 100.5  If you have questions about this conversion, please contact the Pharmacy Department  []  ( [phone] )  [place] [x]  ( [phone] )  [facility] []  ( [phone]   []  ( [phone] )  [facility] []  ( [phone] )  [facility]  

## 2014-11-24 LAB — PROTIME-INR
INR: 1.73
Prothrombin Time: 20.4 seconds — ABNORMAL HIGH (ref 11.4–15.0)

## 2014-11-24 MED ORDER — AZITHROMYCIN 250 MG PO TABS: ORAL_TABLET | ORAL | Status: DC

## 2014-11-24 MED ORDER — WARFARIN SODIUM 7.5 MG PO TABS: 7.5000 mg | ORAL_TABLET | Freq: Once | ORAL | Status: DC

## 2014-11-24 MED ORDER — IPRATROPIUM-ALBUTEROL 0.5-2.5 (3) MG/3ML IN SOLN: 3.0000 mL | RESPIRATORY_TRACT | Status: DC | PRN

## 2014-11-24 MED ORDER — METHYLPREDNISOLONE 4 MG PO TBPK
ORAL_TABLET | ORAL | Status: DC
Start: 2014-11-24 — End: 2014-12-15

## 2014-11-24 NOTE — Progress Notes (Signed)
Patient ID: Stefanie Bryan, female   DOB: 08/13/1927, 81 y.o.   MRN: [id number] [facility] PROGRESS NOTE  PCP: Hillery Aldo, MD  HPI/Subjective: Patient feeling a good today.  Breathing a little bit better today, some cough but nonproductive. Feels good to go home.   Objective: Filed Vitals:   11/24/14 0755  BP: 115/45  Pulse: 63  Temp: 98.2 F (36.8 C)  Resp: 18    Intake/Output Summary (Last 24 hours) at 11/24/14 0758 Last data filed at 11/23/14 2206  Gross per 24 hour  Intake    720 ml  Output    600 ml  Net    120 ml   Filed Weights   11/22/14 0553 11/23/14 0500 11/24/14 0408  Weight: 75.841 kg (167 lb 3.2 oz) 75.779 kg (167 lb 1 oz) 75.841 kg (167 lb 3.2 oz)    ROS: Review of Systems  Constitutional: Negative for fever and chills.  Eyes: Negative for blurred vision.  Respiratory: Positive for cough and shortness of breath. Negative for hemoptysis and sputum production.   Cardiovascular: Negative for chest pain and palpitations.  Gastrointestinal: Negative for nausea, vomiting, abdominal pain, diarrhea and constipation.  Genitourinary: Negative for dysuria.  Musculoskeletal: Negative for joint pain.  Neurological: Negative for dizziness and headaches.   Exam: Physical Exam  HENT:  Nose: No mucosal edema.  Mouth/Throat: No oropharyngeal exudate or posterior oropharyngeal edema.  Eyes: Conjunctivae, EOM and lids are normal. Pupils are equal, round, and reactive to light.  Neck: No JVD present. Carotid bruit is not present. No edema present. No thyroid mass and no thyromegaly present.  Cardiovascular: S1 normal and S2 normal.  Exam reveals no gallop.   Murmur heard.  Systolic murmur is present with a grade of 4/6  Pulses:      Dorsalis pedis pulses are 2+ on the right side, and 2+ on the left side.  Respiratory: No accessory muscle usage. No respiratory distress. She has decreased breath sounds in the right middle field, the right lower field,  the left middle field and the left lower field. She has wheezes in the right middle field, the right lower field, the left middle field and the left lower field. She has no rhonchi. She has no rales.  GI: Soft. Bowel sounds are normal. There is no tenderness.  Musculoskeletal:       Right ankle: She exhibits swelling.       Left ankle: She exhibits swelling.  Lymphadenopathy:    She has no cervical adenopathy.  Neurological: She is alert. No cranial nerve deficit.  Skin: Skin is warm. No rash noted. Nails show no clubbing.  Psychiatric: She has a normal mood and affect.   Data Reviewed: Basic Metabolic Panel:  Recent Labs Lab 11/21/14 0229 11/22/14 0315  NA 144 143  K 4.0 4.7  CL 101 102  CO2 32 34*  GLUCOSE 135* 173*  BUN 48* 47*  CREATININE 1.47* 1.35*  CALCIUM 8.9 8.7*   Liver Function Tests:  Recent Labs Lab 11/21/14 0229  AST 19  ALT 13*  ALKPHOS 101  BILITOT 0.5  PROT 6.8  ALBUMIN 3.5   CBC:  Recent Labs Lab 11/21/14 0229  WBC 5.6  HGB 9.7*  HCT 29.0*  MCV 93.3  PLT 254    Scheduled Meds: . allopurinol  100 mg Oral Daily  . amiodarone  200 mg Oral Daily  . amLODipine  5 mg Oral BID  . azithromycin  500 mg Oral Daily  .  cefTRIAXone (ROCEPHIN)  IV  1 g Intravenous Q24H  . methylPREDNISolone (SOLU-MEDROL) injection  40 mg Intravenous 3 times per day  . metoprolol succinate  25 mg Oral Daily  . pantoprazole  40 mg Oral Daily  . sodium chloride  3 mL Intravenous Q12H  . warfarin  2.5 mg Oral q1800  . Warfarin - Pharmacist Dosing Inpatient   Does not apply q1800   Continuous Infusions:   Assessment/Plan:  1. Pneumonia left lower lobe. Continue Zithromax. White blood cell count now in normal range. 2. Acute on Chronic respiratory failure with pulmonary fibrosis. COPD exacerbation. Continue oxygen supplementation 3 L nasal cannula. Taper Steroids, continue nebulizer treatments. Patient follows with Dr. Meredeth Ide as outpatient. Discharge home today.   3. Essential hypertension continue usual medications. 4. Atrial fibrillation- rate controlled with metoprolol and amiodarone, anticoagulated with warfarin. INR down to 1.73, give 7.5 mg of Coumadin tonight and follow INR as outpt. 5. Gastroesophageal reflux disease without esophagitis on Protonix at home. 6. Chronic kidney disease stage III, looking back at old labs are creatinine has varied between 1.2 and 1.8.Stable  Code Status:     Code Status Orders        Start     Ordered   11/21/14 0520  Do not attempt resuscitation (DNR)   Continuous    Question Answer Comment  In the event of cardiac or respiratory ARREST Do not call a "code blue"   In the event of cardiac or respiratory ARREST Do not perform Intubation, CPR, defibrillation or ACLS   In the event of cardiac or respiratory ARREST Use medication by any route, position, wound care, and other measures to relive pain and suffering. May use oxygen, suction and manual treatment of airway obstruction as needed for comfort.      11/21/14 0520     Disposition Plan: Likely home  Time spent: 20 minutes  [facility]  [facility] Hospitalists

## 2014-11-24 NOTE — Discharge Summary (Signed)
[facility] - [place] at [facility]   PATIENT NAME: Stefanie Bryan    MR#:  [id number]  DATE OF BIRTH:  09-11-1927  DATE OF ADMISSION:  11/21/2014 ADMITTING PHYSICIAN: Oralia Manis, MD  DATE OF DISCHARGE: 11/24/2014  1:38 PM  PRIMARY CARE PHYSICIAN: Hillery Aldo, MD     ADMISSION DIAGNOSIS:  Shortness of breath [R06.02] Pulmonary fibrosis [J84.10] Community acquired pneumonia [J18.9] Chronic obstructive pulmonary disease with acute exacerbation [J44.1] Acute on chronic congestive heart failure, unspecified congestive heart failure type [I50.9]  DISCHARGE DIAGNOSIS:  Principal Problem:   CAP (community acquired pneumonia) Active Problems:   COPD (chronic obstructive pulmonary disease)   CHF (congestive heart failure)   HTN (hypertension)   A-fib   GERD (gastroesophageal reflux disease)   SECONDARY DIAGNOSIS:   Past Medical History  Diagnosis Date  . COPD (chronic obstructive pulmonary disease)   . CHF (congestive heart failure)   . Hypertension   . Atrial fibrillation   . GERD (gastroesophageal reflux disease)     .pro HOSPITAL COURSE:  Stefanie Bryan is a 81 y.o. female who presents with 3 days of increasingly progressive shortness of breath. Patient states that she has had some mild cough, without much sputum production. She denies fevers/chills, chest pain, nausea/vomiting, or other infectious symptoms other than the shortness of breath with cough. She came to the ED because of shortness of breath became so bad that she was concerned. Here she was found to have likely left pneumonia on chest x-ray. Hospitalists were called for admission for community-acquired pneumonia. Of note she does have a history of COPD and CHF, as well as A. fib for which she is anticoagulated with Coumadin. Patient was admitted to the hospital,  started on antibiotics, steroids, inhalation therapy and slowly improved. Blood cultures times 2 were negative,  sputum culture was attempted, but not obtained. Patient was managed on rocephin/zitjhromax combination and her condition improved, she was weaned off to 3 liters of O2 , her baseline. It was felt she was steady to be discharged home today, June 15th, 2016.  Discussion by problem: 1. Pneumonia left lower lobe. Continue Zithromax. White blood cell count now in normal range. Follow up with PCP, pulmonary in 2-3 days 2. Acute on Chronic respiratory failure with hypoxia, exacerbated by underlying pulmonary fibrosis. COPD exacerbation. Continue oxygen supplementation 3 L nasal cannula. Taper Steroids, continue nebulizer treatments. Patient follows with Dr. Meredeth Ide as outpatient. Discharge home today i  Stable condition.  3. Essential hypertension continue usual medications. 4. Atrial fibrillation- rate controlled with metoprolol and amiodarone, anticoagulated with warfarin. INR at  1.73, give 7.5 mg of Coumadin tonight and follow INR as outpt. Continue usual coumadin doses tomorrow, follow INR as outpatient.  5. Gastroesophageal reflux disease without esophagitis on Protonix at home. 6. Chronic kidney disease stage III, looking back at old labs are creatinine has varied between 1.2 and 1.8. Stable now DISCHARGE CONDITIONS:   stable  CONSULTS OBTAINED:  Treatment Team:  Mertie Moores, MD Katharina Caper, MD  DRUG ALLERGIES:   Allergies  Allergen Reactions  . Sulfa Antibiotics     DISCHARGE MEDICATIONS:   Discharge Medication List as of 11/24/2014 12:43 PM    START taking these medications   Details  azithromycin (ZITHROMAX) 250 MG tablet Use daily for 4 days, Print    ipratropium-albuterol (DUONEB) 0.5-2.5 (3) MG/3ML SOLN Take 3 mLs by nebulization every 4 (four) hours as needed., Starting 11/24/2014, Until Discontinued, Print    methylPREDNISolone (MEDROL DOSEPAK)  4 MG TBPK tablet follow package directions, Normal    !! warfarin (COUMADIN) 7.5 MG tablet Take 1 tablet (7.5 mg total) by  mouth one time only at 6 PM. Take once a 11/24/2014, then resume your usual dosing, Starting 11/24/2014, Until Discontinued, Print     !! - Potential duplicate medications found. Please discuss with provider.    CONTINUE these medications which have NOT CHANGED   Details  allopurinol (ZYLOPRIM) 100 MG tablet Take 100 mg by mouth daily., Until Discontinued, Historical Med    amiodarone (PACERONE) 200 MG tablet Take 200 mg by mouth daily., Until Discontinued, Historical Med    amLODipine (NORVASC) 5 MG tablet Take 5 mg by mouth 2 (two) times daily., Until Discontinued, Historical Med    Calcium Carbonate-Vitamin D (CALCIUM 600+D) 600-400 MG-UNIT per tablet Take 1 tablet by mouth daily., Until Discontinued, Historical Med    diltiazem (DILACOR XR) 120 MG 24 hr capsule Take 120 mg by mouth daily., Until Discontinued, Historical Med    furosemide (LASIX) 20 MG tablet Take 20 mg by mouth daily as needed (weight gain/ shortness of breath)., Until Discontinued, Historical Med    gemfibrozil (LOPID) 600 MG tablet Take 600 mg by mouth 2 (two) times daily before a meal., Until Discontinued, Historical Med    metoprolol succinate (TOPROL-XL) 25 MG 24 hr tablet Take 25 mg by mouth daily., Until Discontinued, Historical Med    pantoprazole (PROTONIX) 40 MG tablet Take 40 mg by mouth daily., Until Discontinued, Historical Med    predniSONE (DELTASONE) 10 MG tablet Take 10 mg by mouth daily with breakfast. Take 4 tablets daily for 2 weeks,  then 3 tablets daily for 2 weeks (starting 06/12),  then 2 tablets daily for 2 weeks,  then one tablet daily for 2 weeks, Starting 11/11/2014, Until Discontinued, Historical Me d    vitamin B-12 (CYANOCOBALAMIN) 1000 MCG tablet Take 1,000 mcg by mouth daily., Until Discontinued, Historical Med    !! warfarin (COUMADIN) 5 MG tablet Take 2.5-5 mg by mouth daily at 6 PM. 5mg  on Tuesday, 2.5mg  all other days, Until Discontinued, Historical Med     !! - Potential  duplicate medications found. Please discuss with provider.       DISCHARGE INSTRUCTIONS:    Follow up with PCP, pulmonary in 2-3 days  If you experience worsening of your admission symptoms, develop shortness of breath, life threatening emergency, suicidal or homicidal thoughts you must seek medical attention immediately by calling 911 or calling your MD immediately  if symptoms less severe.  You Must read complete instructions/literature along with all the possible adverse reactions/side effects for all the Medicines you take and that have been prescribed to you. Take any new Medicines after you have completely understood and accept all the possible adverse reactions/side effects.   Please note  You were cared for by a hospitalist during your hospital stay. If you have any questions about your discharge medications or the care you received while you were in the hospital after you are discharged, you can call the unit and asked to speak with the hospitalist on call if the hospitalist that took care of you is not available. Once you are discharged, your primary care physician will handle any further medical issues. Please note that NO REFILLS for any discharge medications will be authorized once you are discharged, as it is imperative that you return to your primary care physician (or establish a relationship with a primary care physician if you do not  have one) for your aftercare needs so that they can reassess your need for medications and monitor your lab values.    Today   CHIEF COMPLAINT:   Chief Complaint  Patient presents with  . Shortness of Breath    HISTORY OF PRESENT ILLNESS:  Stefanie Bryan  is a 81 y.o. female with a known history of chronic respiratory failure, pulmonary fibrosis, who presents with 3 days of increasingly progressive shortness of breath. Patient states that she has had some mild cough, without much sputum production. She denies fevers/chills, chest  pain, nausea/vomiting, or other infectious symptoms other than the shortness of breath with cough. She came to the ED because of shortness of breath became so bad that she was concerned. Here she was found to have likely left pneumonia on chest x-ray. Hospitalists were called for admission for community-acquired pneumonia. Of note she does have a history of COPD and CHF, as well as A. fib for which she is anticoagulated with Coumadin. Patient was admitted to the hospital,  started on antibiotics, steroids, inhalation therapy and slowly improved. Blood cultures times 2 were negative, sputum culture was attempted, but not obtained. Patient was managed on rocephin/zitjhromax combination and her condition improved, she was weaned off to 3 liters of O2 , her baseline. It was felt she was steady to be discharged home today, June 15th, 2016.  Discussion by problem: 7. Pneumonia left lower lobe. Continue Zithromax. White blood cell count now in normal range. Follow up with PCP, pulmonary in 2-3 days 8. Acute on Chronic respiratory failure with hypoxia, exacerbated by underlying pulmonary fibrosis. COPD exacerbation. Continue oxygen supplementation 3 L nasal cannula. Taper Steroids, continue nebulizer treatments. Patient follows with Dr. Meredeth Ide as outpatient. Discharge home today i  Stable condition.  9. Essential hypertension continue usual medications. 10. Atrial fibrillation- rate controlled with metoprolol and amiodarone, anticoagulated with warfarin. INR at  1.73, give 7.5 mg of Coumadin tonight and follow INR as outpt. Continue usual coumadin doses tomorrow, follow INR as outpatient.  11. Gastroesophageal reflux disease without esophagitis on Protonix at home. 12. Chronic kidney disease stage III, looking back at old labs are creatinine has varied between 1.2 and 1.8. Stable now    VITAL SIGNS:  Blood pressure 147/62, pulse 73, temperature 98 F (36.7 C), temperature source Oral, resp. rate 19, height   (1.6 m), weight 75.841 kg (167 lb 3.2 oz), SpO2 99 %.  I/O:   Intake/Output Summary (Last 24 hours) at 11/24/14 1619 Last data filed at 11/24/14 0921  Gross per 24 hour  Intake    480 ml  Output    200 ml  Net    280 ml    PHYSICAL EXAMINATION:  GENERAL:  81 y.o.-year-old patient lying in the bed with no acute distress.  EYES: Pupils equal, round, reactive to light and accommodation. No scleral icterus. Extraocular muscles intact.  HEENT: Head atraumatic, normocephalic. Oropharynx and nasopharynx clear.  NECK:  Supple, no jugular venous distention. No thyroid enlargement, no tenderness.  LUNGS: Normal breath sounds bilaterally, few rales and rhonchi , also some diffuse crepitations. No use of accessory muscles of respiration.  CARDIOVASCULAR: S1, S2 normal. No murmurs, rubs, or gallops.  ABDOMEN: Soft, non-tender, non-distended. Bowel sounds present. No organomegaly or mass.  EXTREMITIES: No pedal edema, cyanosis, or clubbing.  NEUROLOGIC: Cranial nerves II through XII are intact. Muscle strength 5/5 in all extremities. Sensation intact. Gait not checked.  PSYCHIATRIC: The patient is alert and oriented x 3.  SKIN: No obvious rash, lesion, or ulcer.   DATA REVIEW:   CBC  Recent Labs Lab 11/21/14 0229  WBC 5.6  HGB 9.7*  HCT 29.0*  PLT 254    Chemistries   Recent Labs Lab 11/21/14 0229 11/22/14 0315  NA 144 143  K 4.0 4.7  CL 101 102  CO2 32 34*  GLUCOSE 135* 173*  BUN 48* 47*  CREATININE 1.47* 1.35*  CALCIUM 8.9 8.7*  AST 19  --   ALT 13*  --   ALKPHOS 101  --   BILITOT 0.5  --     Cardiac Enzymes  Recent Labs Lab 11/21/14 0229  TROPONINI <0.03    Microbiology Results  Results for orders placed or performed during the hospital encounter of 11/21/14  Culture, blood (routine x 2)     Status: None (Preliminary result)   Collection Time: 11/21/14  4:02 AM  Result Value Ref Range Status   Specimen Description BLOOD  Final   Special Requests  Normal  Final   Culture NO GROWTH 3 DAYS  Final   Report Status PENDING  Incomplete  Culture, blood (routine x 2)     Status: None (Preliminary result)   Collection Time: 11/21/14  4:02 AM  Result Value Ref Range Status   Specimen Description BLOOD  Final   Special Requests Normal  Final   Culture NO GROWTH 3 DAYS  Final   Report Status PENDING  Incomplete  Culture, sputum-assessment     Status: None   Collection Time: 11/21/14 10:26 AM  Result Value Ref Range Status   Specimen Description SPUTUM  Final   Special Requests NONE  Final   Sputum evaluation   Final    Sputum specimen not acceptable for testing.  Please recollect.   CALLED TO AMANDA,RN EXT [id number]     Report Status 11/21/2014 FINAL  Final    RADIOLOGY:  No results found.  EKG:   Orders placed or performed during the hospital encounter of 11/21/14  . EKG 12-Lead  . EKG 12-Lead      Management plans discussed with the patient, family and they are in agreement.  CODE STATUS:     Code Status Orders        Start     Ordered   11/21/14 0520  Do not attempt resuscitation (DNR)   Continuous    Question Answer Comment  In the event of cardiac or respiratory ARREST Do not call a "code blue"   In the event of cardiac or respiratory ARREST Do not perform Intubation, CPR, defibrillation or ACLS   In the event of cardiac or respiratory ARREST Use medication by any route, position, wound care, and other measures to relive pain and suffering. May use oxygen, suction and manual treatment of airway obstruction as needed for comfort.      11/21/14 0520      TOTAL TIME TAKING CARE OF THIS PATIENT: 40 minutes.    Katharina Caper M.D on 11/24/2014 at 4:19 PM  Between 7am to 6pm - Pager - [phone]  After 6pm go to [email or web address] - password [email or web address] [facility]  [facility]  Office  [phone]  CC: Primary care physician; Hillery Aldo, MD

## 2014-11-24 NOTE — Progress Notes (Signed)
SATURATION QUALIFICATIONS: (This note is used to comply with regulatory documentation for home oxygen)  Patient Saturations on Room Air at Rest =87 %      Please briefly explain why patient needs home oxygen:  COPD Exacerbation.

## 2014-11-24 NOTE — Progress Notes (Signed)
ANTICOAGULATION CONSULT NOTE - Follow up  Pharmacy Consult for Coumadin Indication: atrial fibrillation  Allergies  Allergen Reactions  . Sulfa Antibiotics     Patient Measurements: Height: 5\' 3"  (160 cm) Weight: 167 lb 3.2 oz (75.841 kg) IBW/kg (Calculated) : 52.4   Vital Signs: Temp: 98.2 F (36.8 C) (06/15 0755) Temp Source: Oral (06/15 0755) BP: 115/45 mmHg (06/15 0755) Pulse Rate: 63 (06/15 0755)  Labs:  Recent Labs  11/22/14 0315 11/23/14 0420 11/24/14 0448  LABPROT 21.7* 18.2* 20.4*  INR 1.87 1.49 1.73  CREATININE 1.35*  --   --     Estimated Creatinine Clearance: 30.3 mL/min (by C-G formula based on Cr of 1.35).   Medical History: Past Medical History  Diagnosis Date  . COPD (chronic obstructive pulmonary disease)   . CHF (congestive heart failure)   . Hypertension   . Atrial fibrillation   . GERD (gastroesophageal reflux disease)       Assessment: 81 yo female on warfarin PTA for AFib here for CAP Home warfarin dose of 5 mg on Tuesdays, 2.5 mg all other days Also on azithromycin (started this admission) and home amiodarone  6/12: INR 2.06 - warfarin 2.5 mg 6/13: INR 1.87 - warfarin 5 mg 6/14: INR 1.49 - INR decreasing despite increase in dose and possible drug-drug interaction with azithromycin 6/15: INR: 1.73  Goal of Therapy:  INR 2-3 Monitor platelets by anticoagulation protocol: Yes   Plan:  Will continue home dose of warfarin 2.5 mg PO q1800. INR likely to be within goal range soon based on rise from 1.49 to 1.73 with a 7.5 mg once dose on 6/14.      Demetrius Charity, PharmD Clinical Pharmacist 11/24/2014,8:36 AM

## 2014-11-26 LAB — CULTURE, BLOOD (ROUTINE X 2)
Culture: NO GROWTH
Culture: NO GROWTH
SPECIAL REQUESTS: NORMAL
Special Requests: NORMAL

## 2014-12-15 ENCOUNTER — Encounter: Payer: Self-pay | Admitting: Internal Medicine

## 2014-12-15 ENCOUNTER — Emergency Department: Payer: Medicare Other

## 2014-12-15 ENCOUNTER — Inpatient Hospital Stay
Admission: EM | Admit: 2014-12-15 | Discharge: 2015-01-10 | DRG: 193 | Disposition: E | Payer: Medicare Other | Attending: Internal Medicine | Admitting: Internal Medicine

## 2014-12-15 DIAGNOSIS — J449 Chronic obstructive pulmonary disease, unspecified: Secondary | ICD-10-CM | POA: Diagnosis present

## 2014-12-15 DIAGNOSIS — J9601 Acute respiratory failure with hypoxia: Secondary | ICD-10-CM

## 2014-12-15 DIAGNOSIS — Z882 Allergy status to sulfonamides status: Secondary | ICD-10-CM

## 2014-12-15 DIAGNOSIS — Z8701 Personal history of pneumonia (recurrent): Secondary | ICD-10-CM | POA: Diagnosis not present

## 2014-12-15 DIAGNOSIS — J986 Disorders of diaphragm: Secondary | ICD-10-CM | POA: Diagnosis present

## 2014-12-15 DIAGNOSIS — J8410 Pulmonary fibrosis, unspecified: Secondary | ICD-10-CM | POA: Diagnosis present

## 2014-12-15 DIAGNOSIS — Z90710 Acquired absence of both cervix and uterus: Secondary | ICD-10-CM | POA: Diagnosis not present

## 2014-12-15 DIAGNOSIS — N183 Chronic kidney disease, stage 3 (moderate): Secondary | ICD-10-CM | POA: Diagnosis present

## 2014-12-15 DIAGNOSIS — Z9981 Dependence on supplemental oxygen: Secondary | ICD-10-CM | POA: Diagnosis not present

## 2014-12-15 DIAGNOSIS — E663 Overweight: Secondary | ICD-10-CM | POA: Diagnosis present

## 2014-12-15 DIAGNOSIS — M109 Gout, unspecified: Secondary | ICD-10-CM | POA: Diagnosis present

## 2014-12-15 DIAGNOSIS — D638 Anemia in other chronic diseases classified elsewhere: Secondary | ICD-10-CM | POA: Diagnosis present

## 2014-12-15 DIAGNOSIS — Z66 Do not resuscitate: Secondary | ICD-10-CM | POA: Diagnosis not present

## 2014-12-15 DIAGNOSIS — Z7901 Long term (current) use of anticoagulants: Secondary | ICD-10-CM

## 2014-12-15 DIAGNOSIS — I2510 Atherosclerotic heart disease of native coronary artery without angina pectoris: Secondary | ICD-10-CM | POA: Diagnosis present

## 2014-12-15 DIAGNOSIS — M3481 Systemic sclerosis with lung involvement: Secondary | ICD-10-CM | POA: Diagnosis present

## 2014-12-15 DIAGNOSIS — I4891 Unspecified atrial fibrillation: Secondary | ICD-10-CM | POA: Diagnosis present

## 2014-12-15 DIAGNOSIS — Z9851 Tubal ligation status: Secondary | ICD-10-CM

## 2014-12-15 DIAGNOSIS — J9621 Acute and chronic respiratory failure with hypoxia: Secondary | ICD-10-CM | POA: Diagnosis present

## 2014-12-15 DIAGNOSIS — R0902 Hypoxemia: Secondary | ICD-10-CM

## 2014-12-15 DIAGNOSIS — I129 Hypertensive chronic kidney disease with stage 1 through stage 4 chronic kidney disease, or unspecified chronic kidney disease: Secondary | ICD-10-CM | POA: Diagnosis present

## 2014-12-15 DIAGNOSIS — J479 Bronchiectasis, uncomplicated: Secondary | ICD-10-CM | POA: Diagnosis present

## 2014-12-15 DIAGNOSIS — I509 Heart failure, unspecified: Secondary | ICD-10-CM

## 2014-12-15 DIAGNOSIS — Z515 Encounter for palliative care: Secondary | ICD-10-CM

## 2014-12-15 DIAGNOSIS — J189 Pneumonia, unspecified organism: Principal | ICD-10-CM

## 2014-12-15 DIAGNOSIS — G839 Paralytic syndrome, unspecified: Secondary | ICD-10-CM | POA: Diagnosis present

## 2014-12-15 DIAGNOSIS — K219 Gastro-esophageal reflux disease without esophagitis: Secondary | ICD-10-CM | POA: Diagnosis present

## 2014-12-15 DIAGNOSIS — I5032 Chronic diastolic (congestive) heart failure: Secondary | ICD-10-CM | POA: Diagnosis present

## 2014-12-15 DIAGNOSIS — A047 Enterocolitis due to Clostridium difficile: Secondary | ICD-10-CM | POA: Diagnosis not present

## 2014-12-15 DIAGNOSIS — Z9049 Acquired absence of other specified parts of digestive tract: Secondary | ICD-10-CM | POA: Diagnosis present

## 2014-12-15 DIAGNOSIS — Y95 Nosocomial condition: Secondary | ICD-10-CM | POA: Diagnosis present

## 2014-12-15 DIAGNOSIS — I10 Essential (primary) hypertension: Secondary | ICD-10-CM | POA: Diagnosis present

## 2014-12-15 HISTORY — DX: Atherosclerotic heart disease of native coronary artery without angina pectoris: I25.10

## 2014-12-15 HISTORY — DX: Cardiac murmur, unspecified: R01.1

## 2014-12-15 HISTORY — DX: Reserved for inherently not codable concepts without codable children: IMO0001

## 2014-12-15 HISTORY — DX: Angina pectoris, unspecified: I20.9

## 2014-12-15 HISTORY — DX: Chronic kidney disease, unspecified: N18.9

## 2014-12-15 LAB — CBC WITH DIFFERENTIAL/PLATELET
BASOS PCT: 0 %
Basophils Absolute: 0 10*3/uL (ref 0–0.1)
EOS ABS: 0 10*3/uL (ref 0–0.7)
EOS PCT: 2 %
HCT: 23.9 % — ABNORMAL LOW (ref 35.0–47.0)
Hemoglobin: 7.6 g/dL — ABNORMAL LOW (ref 12.0–16.0)
LYMPHS ABS: 0.5 10*3/uL — AB (ref 1.0–3.6)
Lymphocytes Relative: 19 %
MCH: 29.7 pg (ref 26.0–34.0)
MCHC: 31.9 g/dL — AB (ref 32.0–36.0)
MCV: 92.8 fL (ref 80.0–100.0)
Monocytes Absolute: 0.2 10*3/uL (ref 0.2–0.9)
Monocytes Relative: 9 %
NEUTROS PCT: 70 %
Neutro Abs: 1.9 10*3/uL (ref 1.4–6.5)
Platelets: 250 10*3/uL (ref 150–440)
RBC: 2.58 MIL/uL — AB (ref 3.80–5.20)
RDW: 15.1 % — ABNORMAL HIGH (ref 11.5–14.5)
WBC: 2.6 10*3/uL — ABNORMAL LOW (ref 3.6–11.0)

## 2014-12-15 LAB — COMPREHENSIVE METABOLIC PANEL
ALBUMIN: 3.6 g/dL (ref 3.5–5.0)
ALT: 16 U/L (ref 14–54)
ANION GAP: 9 (ref 5–15)
AST: 29 U/L (ref 15–41)
Alkaline Phosphatase: 92 U/L (ref 38–126)
BILIRUBIN TOTAL: 0.6 mg/dL (ref 0.3–1.2)
BUN: 23 mg/dL — AB (ref 6–20)
CHLORIDE: 99 mmol/L — AB (ref 101–111)
CO2: 33 mmol/L — ABNORMAL HIGH (ref 22–32)
CREATININE: 1.49 mg/dL — AB (ref 0.44–1.00)
Calcium: 9.5 mg/dL (ref 8.9–10.3)
GFR calc Af Amer: 36 mL/min — ABNORMAL LOW (ref >60)
GFR calc non Af Amer: 31 mL/min — ABNORMAL LOW (ref >60)
Glucose, Bld: 77 mg/dL (ref 65–99)
Potassium: 4 mmol/L (ref 3.5–5.1)
Sodium: 141 mmol/L (ref 135–145)
TOTAL PROTEIN: 6.9 g/dL (ref 6.5–8.1)

## 2014-12-15 LAB — IRON AND TIBC
Iron: 30 ug/dL (ref 28–170)
Saturation Ratios: 6 % — ABNORMAL LOW (ref 10.4–31.8)
TIBC: 532 ug/dL — ABNORMAL HIGH (ref 250–450)
UIBC: 502 ug/dL

## 2014-12-15 LAB — FOLATE: Folate: 16.2 ng/mL (ref >5.9)

## 2014-12-15 LAB — VITAMIN B12: Vitamin B-12: 769 pg/mL (ref 180–914)

## 2014-12-15 LAB — APTT: APTT: 58 s — AB (ref 24–36)

## 2014-12-15 LAB — LACTIC ACID, PLASMA
LACTIC ACID, VENOUS: 0.8 mmol/L (ref 0.5–2.0)
Lactic Acid, Venous: 1.2 mmol/L (ref 0.5–2.0)

## 2014-12-15 LAB — FERRITIN: FERRITIN: 177 ng/mL (ref 11–307)

## 2014-12-15 LAB — TROPONIN I: Troponin I: <0.03 ng/mL (ref <0.031)

## 2014-12-15 LAB — PROTIME-INR
INR: 2.74
Prothrombin Time: 29.1 seconds — ABNORMAL HIGH (ref 11.4–15.0)

## 2014-12-15 LAB — LIPASE, BLOOD: LIPASE: 96 U/L — AB (ref 22–51)

## 2014-12-15 LAB — RETICULOCYTES
RBC.: 2.56 MIL/uL — ABNORMAL LOW (ref 3.80–5.20)
RETIC CT PCT: 3.8 % — AB (ref 0.4–3.1)
Retic Count, Absolute: 97.3 10*3/uL (ref 19.0–183.0)

## 2014-12-15 MED ORDER — SODIUM CHLORIDE 0.9 % IV BOLUS (SEPSIS): 500.0000 mL | INTRAVENOUS | Status: DC

## 2014-12-15 MED ORDER — PANTOPRAZOLE SODIUM 40 MG PO TBEC
40.0000 mg | DELAYED_RELEASE_TABLET | Freq: Every day | ORAL | Status: DC
  Administered 2014-12-15 – 2014-12-18 (×4): 40 mg via ORAL
  Filled 2014-12-15 (×5): qty 1

## 2014-12-15 MED ORDER — DILTIAZEM HCL ER COATED BEADS 120 MG PO CP24
120.0000 mg | ORAL_CAPSULE | Freq: Every day | ORAL | Status: DC
  Administered 2014-12-15 – 2014-12-18 (×2): 120 mg via ORAL
  Filled 2014-12-15 (×10): qty 1

## 2014-12-15 MED ORDER — SODIUM CHLORIDE 0.9 % IV BOLUS (SEPSIS): 1000.0000 mL | INTRAVENOUS | Status: DC

## 2014-12-15 MED ORDER — PIPERACILLIN-TAZOBACTAM 3.375 G IVPB
3.3750 g | Freq: Three times a day (TID) | INTRAVENOUS | Status: DC
  Administered 2014-12-15 – 2014-12-20 (×14): 3.375 g via INTRAVENOUS
  Filled 2014-12-15 (×18): qty 50

## 2014-12-15 MED ORDER — ONDANSETRON HCL 4 MG PO TABS: 4.0000 mg | ORAL_TABLET | Freq: Four times a day (QID) | ORAL | Status: DC | PRN

## 2014-12-15 MED ORDER — GEMFIBROZIL 600 MG PO TABS
600.0000 mg | ORAL_TABLET | Freq: Two times a day (BID) | ORAL | Status: DC
  Administered 2014-12-15 – 2014-12-19 (×8): 600 mg via ORAL
  Filled 2014-12-15 (×9): qty 1

## 2014-12-15 MED ORDER — ONDANSETRON HCL 4 MG/2ML IJ SOLN
4.0000 mg | Freq: Four times a day (QID) | INTRAMUSCULAR | Status: DC | PRN
  Administered 2014-12-20: 4 mg via INTRAVENOUS
  Filled 2014-12-15: qty 2

## 2014-12-15 MED ORDER — VANCOMYCIN HCL IN DEXTROSE 1-5 GM/200ML-% IV SOLN
1000.0000 mg | INTRAVENOUS | Status: DC
  Administered 2014-12-16 – 2014-12-17 (×2): 1000 mg via INTRAVENOUS
  Filled 2014-12-15 (×3): qty 200

## 2014-12-15 MED ORDER — VANCOMYCIN HCL IN DEXTROSE 1-5 GM/200ML-% IV SOLN
INTRAVENOUS | Status: AC
  Administered 2014-12-15: 1000 mg via INTRAVENOUS
  Filled 2014-12-15: qty 200

## 2014-12-15 MED ORDER — SODIUM CHLORIDE 0.9 % IV BOLUS (SEPSIS)
1000.0000 mL | Freq: Once | INTRAVENOUS | Status: AC
  Administered 2014-12-15: 1000 mL via INTRAVENOUS

## 2014-12-15 MED ORDER — PIPERACILLIN-TAZOBACTAM 3.375 G IVPB
INTRAVENOUS | Status: AC
  Administered 2014-12-15: 3.375 g via INTRAVENOUS
  Filled 2014-12-15: qty 50

## 2014-12-15 MED ORDER — VANCOMYCIN HCL IN DEXTROSE 1-5 GM/200ML-% IV SOLN
1000.0000 mg | Freq: Once | INTRAVENOUS | Status: AC
  Administered 2014-12-15: 1000 mg via INTRAVENOUS

## 2014-12-15 MED ORDER — ACETAMINOPHEN 325 MG PO TABS: 650.0000 mg | ORAL_TABLET | Freq: Four times a day (QID) | ORAL | Status: DC | PRN

## 2014-12-15 MED ORDER — METHYLPREDNISOLONE SODIUM SUCC 125 MG IJ SOLR
60.0000 mg | Freq: Four times a day (QID) | INTRAMUSCULAR | Status: DC
  Administered 2014-12-15 – 2014-12-16 (×4): 60 mg via INTRAVENOUS
  Filled 2014-12-15 (×3): qty 2

## 2014-12-15 MED ORDER — METOPROLOL SUCCINATE ER 25 MG PO TB24
25.0000 mg | ORAL_TABLET | Freq: Every day | ORAL | Status: DC
  Administered 2014-12-15 – 2014-12-18 (×2): 25 mg via ORAL
  Filled 2014-12-15 (×4): qty 1

## 2014-12-15 MED ORDER — FUROSEMIDE 10 MG/ML IJ SOLN
40.0000 mg | Freq: Once | INTRAMUSCULAR | Status: AC
  Administered 2014-12-15: 40 mg via INTRAVENOUS

## 2014-12-15 MED ORDER — ACETAMINOPHEN 650 MG RE SUPP: 650.0000 mg | Freq: Four times a day (QID) | RECTAL | Status: DC | PRN

## 2014-12-15 MED ORDER — WARFARIN SODIUM 5 MG PO TABS
2.5000 mg | ORAL_TABLET | Freq: Once | ORAL | Status: AC
  Administered 2014-12-15: 2.5 mg via ORAL
  Filled 2014-12-15: qty 1

## 2014-12-15 MED ORDER — AMIODARONE HCL 200 MG PO TABS
200.0000 mg | ORAL_TABLET | Freq: Every day | ORAL | Status: DC
  Administered 2014-12-15 – 2014-12-17 (×3): 200 mg via ORAL
  Filled 2014-12-15 (×3): qty 1

## 2014-12-15 MED ORDER — WARFARIN - PHARMACIST DOSING INPATIENT
Freq: Every day | Status: DC
  Administered 2014-12-15 – 2014-12-16 (×2)

## 2014-12-15 MED ORDER — IPRATROPIUM-ALBUTEROL 0.5-2.5 (3) MG/3ML IN SOLN
3.0000 mL | RESPIRATORY_TRACT | Status: DC
  Administered 2014-12-15 – 2014-12-20 (×25): 3 mL via RESPIRATORY_TRACT
  Filled 2014-12-15 (×25): qty 3

## 2014-12-15 MED ORDER — PIPERACILLIN-TAZOBACTAM 3.375 G IVPB 30 MIN
3.3750 g | Freq: Three times a day (TID) | INTRAVENOUS | Status: DC
  Administered 2014-12-15: 3.375 g via INTRAVENOUS
  Filled 2014-12-15 (×3): qty 50

## 2014-12-15 MED ORDER — VITAMIN B-12 100 MCG PO TABS
1000.0000 ug | ORAL_TABLET | Freq: Every day | ORAL | Status: DC
  Administered 2014-12-16 – 2014-12-19 (×4): 1000 ug via ORAL
  Filled 2014-12-15: qty 10
  Filled 2014-12-15 (×3): qty 1

## 2014-12-15 MED ORDER — ALLOPURINOL 100 MG PO TABS
100.0000 mg | ORAL_TABLET | Freq: Every day | ORAL | Status: DC
  Administered 2014-12-16 – 2014-12-19 (×4): 100 mg via ORAL
  Filled 2014-12-15 (×5): qty 1

## 2014-12-15 MED ORDER — AMLODIPINE BESYLATE 5 MG PO TABS
5.0000 mg | ORAL_TABLET | Freq: Two times a day (BID) | ORAL | Status: DC
  Administered 2014-12-15 – 2014-12-16 (×2): 5 mg via ORAL
  Filled 2014-12-15 (×2): qty 1

## 2014-12-15 MED ORDER — FUROSEMIDE 20 MG PO TABS
20.0000 mg | ORAL_TABLET | Freq: Every day | ORAL | Status: DC
  Administered 2014-12-16: 20 mg via ORAL
  Filled 2014-12-15 (×2): qty 1

## 2014-12-15 MED ORDER — MAGNESIUM HYDROXIDE 400 MG/5ML PO SUSP: 30.0000 mL | Freq: Every day | ORAL | Status: DC | PRN

## 2014-12-15 NOTE — Progress Notes (Signed)
ANTIBIOTIC CONSULT NOTE - INITIAL  Pharmacy Consult for vancomycin/Zosyn Indication: pneumonia (HCAP)  Allergies  Allergen Reactions  . Adhesive [Tape] Rash  . Sulfa Antibiotics Rash    Patient Measurements: Height: 5\' 3"  (160 cm) Weight: 165 lb (74.844 kg) IBW/kg (Calculated) : 52.4 Adjusted Body Weight: 61.4 kg  Vital Signs: Temp: 98.6 F (37 C) (07/06 1213) BP: 116/50 mmHg (07/06 1430) Pulse Rate: 61 (07/06 1430) Intake/Output from previous day:   Intake/Output from this shift:    Labs:  Recent Labs  01/06/2015 1219  WBC 2.6*  HGB 7.6*  PLT 250  CREATININE 1.49*   Estimated Creatinine Clearance: 27.2 mL/min (by C-G formula based on Cr of 1.49). No results for input(s): VANCOTROUGH, VANCOPEAK, VANCORANDOM, GENTTROUGH, GENTPEAK, GENTRANDOM, TOBRATROUGH, TOBRAPEAK, TOBRARND, AMIKACINPEAK, AMIKACINTROU, AMIKACIN in the last 72 hours.   Microbiology: Recent Results (from the past 720 hour(s))  Culture, blood (routine x 2)     Status: None   Collection Time: 11/21/14  4:02 AM  Result Value Ref Range Status   Specimen Description BLOOD  Final   Special Requests Normal  Final   Culture NO GROWTH 5 DAYS  Final   Report Status 11/26/2014 FINAL  Final  Culture, blood (routine x 2)     Status: None   Collection Time: 11/21/14  4:02 AM  Result Value Ref Range Status   Specimen Description BLOOD  Final   Special Requests Normal  Final   Culture NO GROWTH 5 DAYS  Final   Report Status 11/26/2014 FINAL  Final  Culture, sputum-assessment     Status: None   Collection Time: 11/21/14 10:26 AM  Result Value Ref Range Status   Specimen Description SPUTUM  Final   Special Requests NONE  Final   Sputum evaluation   Final    Sputum specimen not acceptable for testing.  Please recollect.   CALLED TO AMANDA,RN EXT [id number]     Report Status 11/21/2014 FINAL  Final    Medical History: Past Medical History  Diagnosis Date  . Hypertension   . Atrial fibrillation   . GERD  (gastroesophageal reflux disease)   . Anginal pain   . Heart murmur   . Shortness of breath dyspnea   . Chronic kidney disease   . COPD (chronic obstructive pulmonary disease)     Pulmonary fibrosis possibly due to scleroderma, follows with Dr. Meredeth IdeFleming  . Coronary artery disease     Follows with Dr. Juliann Paresallwood  . CHF (congestive heart failure)     Diastolic heart failure, last echo June 2016 with ejection fraction 70%     Assessment: 81 yo female admitted with HCAP  BCx x2 pending Sputum cx pending  Ke 0.024, half life 29 h, Vd 43L  Goal of Therapy:  Vancomycin trough level 15-20 mcg/ml  Plan:  Pt received Zosyn 3.375 g IV x1 in the ED at 1247.  Will continue dosing with Zosyn 3.375 g IV q8h EI   Pt received vancomycin 1 g IV x1 in the ED at 1253.  Will continue dosing with vancomycin 1 g IV q36 h starting on 7/7 at 0800.  Will order trough prior to 3rd dose (will not be at steady state) - 7/10 at 0730 Will need to continue to monitor renal function and culture results   Crist FatWang, Abby Stines L 01/08/2015,2:58 PM

## 2014-12-15 NOTE — Plan of Care (Signed)
Problem: Discharge Progression Outcomes Goal: Pain controlled with appropriate interventions Outcome: Progressing No pain c/o Goal: Hemodynamically stable Outcome: Progressing sr on moniter. Iv abx  And solumedrol/ nebs Goal: Activity appropriate for discharge plan Outcome: Progressing Up ad lib to br with standby assist. Sob with increased exertion but eases at rest Goal: Other Discharge Outcomes/Goals Outcome: Progressing Will return home when improves

## 2014-12-15 NOTE — ED Notes (Signed)
Patient from home with c/o shob. Pt on chronic 3l [place] o2 at home. Dyspnea at rest. Recent dx of pneumonia

## 2014-12-15 NOTE — ED Provider Notes (Signed)
[facility] Emergency Department Provider Note  ____________________________________________  Time seen: 12:05 PM on arrival by EMS  I have reviewed the triage vital signs and the nursing notes.   HISTORY  Chief Complaint No chief complaint on file.    HPI Stefanie Bryan is a 81 y.o. female is brought to the ED for shortness of breath that gets worse when she gets up and walks around. She has a history of COPD and is on 3 L nasal cannula at baseline. On this, her sats have been in the low 80s over the past several days. She was treated for pneumonia 2-3 weeks ago and hospitalized at that time. She completed the enema course and was feeling better, however she is now begun to get worse again and this feels similar to that pneumonia. She does have a cough malaise and fatigue. She denies chest pain but does have discomfort with deep breathing.     Past Medical History  Diagnosis Date  . COPD (chronic obstructive pulmonary disease)   . CHF (congestive heart failure)   . Hypertension   . Atrial fibrillation   . GERD (gastroesophageal reflux disease)     Patient Active Problem List   Diagnosis Date Noted  . CAP (community acquired pneumonia) 11/21/2014  . COPD (chronic obstructive pulmonary disease) 11/21/2014  . CHF (congestive heart failure) 11/21/2014  . HTN (hypertension) 11/21/2014  . A-fib 11/21/2014  . GERD (gastroesophageal reflux disease) 11/21/2014    Past Surgical History  Procedure Laterality Date  . Abdominal hysterectomy    . Tubal ligation    . Cholecystectomy    . Knee surgery      right and left    Current Outpatient Rx  Name  Route  Sig  Dispense  Refill  . allopurinol (ZYLOPRIM) 100 MG tablet   Oral   Take 100 mg by mouth daily.         Marland Kitchen amiodarone (PACERONE) 200 MG tablet   Oral   Take 200 mg by mouth daily.         Marland Kitchen amLODipine (NORVASC) 5 MG tablet   Oral   Take 5 mg by mouth 2 (two) times daily.          Marland Kitchen azithromycin (ZITHROMAX) 250 MG tablet      Use daily for 4 days   4 each   0   . Calcium Carbonate-Vitamin D (CALCIUM 600+D) 600-400 MG-UNIT per tablet   Oral   Take 1 tablet by mouth daily.         Marland Kitchen diltiazem (DILACOR XR) 120 MG 24 hr capsule   Oral   Take 120 mg by mouth daily.         . furosemide (LASIX) 20 MG tablet   Oral   Take 20 mg by mouth daily as needed (weight gain/ shortness of breath).         Marland Kitchen gemfibrozil (LOPID) 600 MG tablet   Oral   Take 600 mg by mouth 2 (two) times daily before a meal.         . ipratropium-albuterol (DUONEB) 0.5-2.5 (3) MG/3ML SOLN   Nebulization   Take 3 mLs by nebulization every 4 (four) hours as needed.   360 mL   2   . methylPREDNISolone (MEDROL DOSEPAK) 4 MG TBPK tablet      follow package directions   21 tablet   0   . metoprolol succinate (TOPROL-XL) 25 MG 24 hr tablet   Oral  Take 25 mg by mouth daily.         . pantoprazole (PROTONIX) 40 MG tablet   Oral   Take 40 mg by mouth daily.         . predniSONE (DELTASONE) 10 MG tablet   Oral   Take 10 mg by mouth daily with breakfast. Take 4 tablets daily for 2 weeks,  then 3 tablets daily for 2 weeks (starting 06/12),  then 2 tablets daily for 2 weeks,  then one tablet daily for 2 weeks         . vitamin B-12 (CYANOCOBALAMIN) 1000 MCG tablet   Oral   Take 1,000 mcg by mouth daily.         Marland Kitchen. warfarin (COUMADIN) 5 MG tablet   Oral   Take 2.5-5 mg by mouth daily at 6 PM. 5mg  on Tuesday, 2.5mg  all other days         . warfarin (COUMADIN) 7.5 MG tablet   Oral   Take 1 tablet (7.5 mg total) by mouth one time only at 6 PM. Take once a 11/24/2014, then resume your usual dosing   1 tablet   0     Allergies Sulfa antibiotics  Family History  Problem Relation Age of Onset  . Diverticulitis Brother     Social History History  Substance Use Topics  . Smoking status: Never Smoker   . Smokeless tobacco: Never Used  . Alcohol Use: No     Review of Systems  Constitutional: No fever or chills. No weight changes Eyes:No blurry vision or double vision.  ENT: No sore throat. Cardiovascular: No chest pain. Respiratory: Shortness of breath and cough. Gastrointestinal: Negative for abdominal pain, vomiting and diarrhea.  No BRBPR or melena. Genitourinary: Negative for dysuria, urinary retention, bloody urine, or difficulty urinating. Musculoskeletal: Negative for back pain. No joint swelling or pain. Skin: Negative for rash. Neurological: Negative for headaches, focal weakness or numbness. Psychiatric:No anxiety or depression.   Endocrine:No hot/cold intolerance, changes in energy, or sleep difficulty.  10-point ROS otherwise negative.  ____________________________________________   PHYSICAL EXAM:  VITAL SIGNS: ED Triage Vitals  Enc Vitals Group     BP --      Pulse --      Resp --      Temp --      Temp src --      SpO2 --      Weight --      Height --      Head Cir --      Peak Flow --      Pain Score --      Pain Loc --      Pain Edu? --      Excl. in GC? --      Constitutional: Alert and oriented. Moderate respiratory distress. Eyes: No scleral icterus. No conjunctival pallor. PERRL. EOMI ENT   Head: Normocephalic and atraumatic.   Nose: No congestion/rhinnorhea. No septal hematoma   Mouth/Throat: Dry mucous membranes, no pharyngeal erythema. No peritonsillar mass. No uvula shift.   Neck: No stridor. No SubQ emphysema. No meningismus. Hematological/Lymphatic/Immunilogical: No cervical lymphadenopathy. Cardiovascular: RRR. Normal and symmetric distal pulses are present in all extremities. No murmurs, rubs, or gallops. Respiratory: Diffuse rhonchi, worse on the right side. Tachypnea.. Gastrointestinal: Soft and nontender. No distention. There is no CVA tenderness.  No rebound, rigidity, or guarding. Genitourinary: deferred Musculoskeletal: Nontender with normal range of motion in  all extremities. No joint effusions.  No  lower extremity tenderness.  No edema. Neurologic:   Normal speech and language.  CN 2-10 normal. Motor grossly intact. No pronator drift.  Normal gait. No gross focal neurologic deficits are appreciated.  Skin:  Skin is warm, dry and intact. No rash noted.  No petechiae, purpura, or bullae. Poor skin turgor Psychiatric: Mood and affect are normal. Speech and behavior are normal. Patient exhibits appropriate insight and judgment.  ____________________________________________    LABS (pertinent positives/negatives) (all labs ordered are listed, but only abnormal results are displayed) Labs Reviewed  CBC WITH DIFFERENTIAL/PLATELET - Abnormal; Notable for the following:    WBC 2.6 (*)    RBC 2.58 (*)    Hemoglobin 7.6 (*)    HCT 23.9 (*)    MCHC 31.9 (*)    RDW 15.1 (*)    Lymphs Abs 0.5 (*)    All other components within normal limits  CULTURE, BLOOD (ROUTINE X 2)  CULTURE, BLOOD (ROUTINE X 2)  CULTURE, EXPECTORATED SPUTUM-ASSESSMENT  LACTIC ACID, PLASMA  LACTIC ACID, PLASMA  COMPREHENSIVE METABOLIC PANEL  LIPASE, BLOOD  TROPONIN I  APTT  PROTIME-INR  BLOOD GAS, VENOUS   ____________________________________________   EKG  Interpreted by me Normal sinus rhythm rate of 70, normal axis and intervals, normal QRS, ST segments, and T waves  ____________________________________________    RADIOLOGY  Chest x-ray shows worsened multifocal infiltrates consistent with pneumonia  ____________________________________________   PROCEDURES CRITICAL CARE Performed by: Scotty Court, Symantha Steeber   Total critical care time: 35 minutes  Critical care time was exclusive of separately billable procedures and treating other patients.  Critical care was necessary to treat or prevent imminent or life-threatening deterioration.  Critical care was time spent personally by me on the following activities: development of treatment plan with patient  and/or surrogate as well as nursing, discussions with consultants, evaluation of patient's response to treatment, examination of patient, obtaining history from patient or surrogate, ordering and performing treatments and interventions, ordering and review of laboratory studies, ordering and review of radiographic studies, pulse oximetry and re-evaluation of patient's condition.  ____________________________________________   INITIAL IMPRESSION / ASSESSMENT AND PLAN / ED COURSE  Pertinent labs & imaging results that were available during my care of the patient were reviewed by me and considered in my medical decision making (see chart for details).  Patient presents with acute on chronic respiratory failure with severe hypoxia with her oxygen saturation on her usual 3 L nasal cannula of 81-82%. It would very likely be below 80% if her oxygen were decreased from baseline. On increasing it to 6 L nasal cannula, oxygen saturation increases to 88-89% which is adequate for now. We'll get blood work blood cultures chest x-ray and start empiric antibiotics for healthcare associated pneumonia. ----------------------------------------- 1:08 PM on 31-Dec-2014 -----------------------------------------  Vitals remained stable in the ED. Workup reveals multifocal pneumonia that is worsened. There is also neutropenia at this time. We'll continue antibiotics and plan for admission for healthcare associated pneumonia and severe hypoxic respiratory failure ____________________________________________   FINAL CLINICAL IMPRESSION(S) / ED DIAGNOSES  Final diagnoses:  Healthcare-associated pneumonia  Acute respiratory failure with hypoxia      Sharman Cheek, MD 12-31-14 1309

## 2014-12-15 NOTE — Progress Notes (Signed)
ANTICOAGULATION CONSULT NOTE - Initial Consult  Pharmacy Consult for Coumadin Indication: atrial fibrillation  Allergies  Allergen Reactions  . Adhesive [Tape] Rash  . Sulfa Antibiotics Rash    Patient Measurements: Height:  (160 cm) Weight: 171 lb 3 oz (77.65 kg) IBW/kg (Calculated) : 52.4 Heparin Dosing Weight: n/a  Vital Signs: Temp: 97.9 F (36.6 C) (07/06 1459) Temp Source: Oral (07/06 1459) BP: 149/79 mmHg (07/06 1459) Pulse Rate: 67 (07/06 1459)  Labs:  Recent Labs  12/29/2014 1219  HGB 7.6*  HCT 23.9*  PLT 250  APTT 58*  LABPROT 29.1*  INR 2.74  CREATININE 1.49*  TROPONINI <0.03    Estimated Creatinine Clearance: 27.7 mL/min (by C-G formula based on Cr of 1.49).   Medical History: Past Medical History  Diagnosis Date  . Hypertension   . Atrial fibrillation   . GERD (gastroesophageal reflux disease)   . Anginal pain   . Heart murmur   . Shortness of breath dyspnea   . Chronic kidney disease   . COPD (chronic obstructive pulmonary disease)     Pulmonary fibrosis possibly due to scleroderma, follows with Dr. Meredeth Ide  . Coronary artery disease     Follows with Dr. Juliann Pares  . CHF (congestive heart failure)     Diastolic heart failure, last echo June 2016 with ejection fraction 70%    Medications:  Prescriptions prior to admission  Medication Sig Dispense Refill Last Dose  . allopurinol (ZYLOPRIM) 100 MG tablet Take 100 mg by mouth daily.   01/03/2015 at Unknown time  . amiodarone (PACERONE) 200 MG tablet Take 200 mg by mouth at bedtime.    12/14/2014 at Unknown time  . amLODipine (NORVASC) 5 MG tablet Take 5 mg by mouth 2 (two) times daily.   12/24/2014 at Unknown time  . Calcium Carbonate-Vitamin D (CALCIUM 600+D) 600-400 MG-UNIT per tablet Take 1 tablet by mouth 2 (two) times daily.    01/05/2015 at Unknown time  . diltiazem (DILACOR XR) 120 MG 24 hr capsule Take 120 mg by mouth at bedtime.    12/14/2014 at Unknown time  . furosemide (LASIX) 20 MG  tablet Take 20 mg by mouth daily.    12/13/2014 at Unknown time  . gemfibrozil (LOPID) 600 MG tablet Take 600 mg by mouth 2 (two) times daily before a meal.   12/18/2014 at Unknown time  . ipratropium-albuterol (DUONEB) 0.5-2.5 (3) MG/3ML SOLN Take 3 mLs by nebulization every 4 (four) hours as needed. (Patient taking differently: Take 3 mLs by nebulization every 4 (four) hours as needed (for shortness of breath). ) 360 mL 2 12/17/2014 at Unknown time  . metoprolol succinate (TOPROL-XL) 25 MG 24 hr tablet Take 25 mg by mouth at bedtime.    12/14/2014 at 2130  . pantoprazole (PROTONIX) 40 MG tablet Take 40 mg by mouth at bedtime.    12/14/2014 at Unknown time  . vitamin B-12 (CYANOCOBALAMIN) 1000 MCG tablet Take 1,000 mcg by mouth daily.   12/13/2014 at Unknown time  . warfarin (COUMADIN) 5 MG tablet Take 2.5-5 mg by mouth every evening. Pt takes one tablet on Tuesday and one-half tablet all other days.   12/14/2014 at Unknown time  . warfarin (COUMADIN) 7.5 MG tablet Take 1 tablet (7.5 mg total) by mouth one time only at 6 PM. Take once a 11/24/2014, then resume your usual dosing (Patient not taking: Reported on 01/08/2015) 1 tablet 0     Assessment: Patient with history of a-fib to be continued on  coumadin per pharmacy dosing. INR today is 2.74 and within therapeutic range. Home dosing of Coumadin is 5mg  on Tuesdays and 2.5mg  all other days.  Goal of Therapy:  INR 2-3 Monitor platelets by anticoagulation protocol: Yes   Plan:  Will order Coumadin 2.5mg  for today. Will check daily INRs and order daily Coumadin dose according to INR results.  Clovia CuffLisa Charelle Petrakis, PharmD, BCPS 01/02/2015 3:38 PM

## 2014-12-15 NOTE — H&P (Signed)
[facility] [place] [facility]   PATIENT NAME: Stefanie Bryan    MR#:  [id number]  DATE OF BIRTH:  1928-01-11  DATE OF ADMISSION:  01/02/2015  PRIMARY CARE PHYSICIAN: Hillery AldoPATEL, SARAH, MD   REQUESTING/REFERRING PHYSICIAN: Dr. Sharman CheekPhillip Stafford  CHIEF COMPLAINT:   Chief Complaint  Patient presents with  . Shortness of Breath    HISTORY OF PRESENT ILLNESS:  Stefanie Dandymmogene Sorter  is a 81 y.o. female with a known history of pulmonary fibrosis with chronic respiratory failure requiring 3 L of nasal cannula at all times, coronary artery disease, diastolic congestive heart failure, recent admission from June 12 to June 15 for community-acquired pneumonia who presents today with worsening shortness of breath, cough, sputum and hemoptysis. She reports that after her initial discharge in June she felt better. She was doing well on 3 L and performing her own ADLs. For the past week to 10 days she has had worsening shortness of breath with any exertion and increasing cough.  She denies nausea vomiting diarrhea fevers or chills. She has not been wheezing. She states that her sputum is brown tinged, no frank bloody sputum. She does have pain with deep inspiration but no chest pain with exertion.  On emergency room evaluation she is found to have a new multifocal pneumonia on chest x-ray and be hypoxic at 82% on 3 L, now requiring 6 L via nasal cannula to keep oxygen saturations in the 90s. She is being admitted for treatment of healthcare associated pneumonia and acute on chronic respiratory failure with hypoxia.  PAST MEDICAL HISTORY:   Past Medical History  Diagnosis Date  . COPD (chronic obstructive pulmonary disease)   . CHF (congestive heart failure)   . Hypertension   . Atrial fibrillation   . GERD (gastroesophageal reflux disease)   . Coronary artery disease   . Anginal pain   . Heart murmur   . Shortness of breath dyspnea   . Chronic kidney disease      PAST SURGICAL HISTORY:   Past Surgical History  Procedure Laterality Date  . Abdominal hysterectomy    . Tubal ligation    . Cholecystectomy    . Knee surgery      right and left  . Coronary angioplasty      SOCIAL HISTORY:   History  Substance Use Topics  . Smoking status: Never Smoker   . Smokeless tobacco: Never Used  . Alcohol Use: No    FAMILY HISTORY:   Family History  Problem Relation Age of Onset  . Diverticulitis Brother     DRUG ALLERGIES:   Allergies  Allergen Reactions  . Adhesive [Tape] Rash  . Sulfa Antibiotics Rash    REVIEW OF SYSTEMS:   Review of Systems  Constitutional: Negative for fever, chills, weight loss and malaise/fatigue.  HENT: Positive for congestion. Negative for hearing loss and sore throat.   Eyes: Negative for blurred vision and pain.  Respiratory: Positive for cough, hemoptysis, sputum production, shortness of breath and wheezing. Negative for stridor.   Cardiovascular: Positive for leg swelling. Negative for chest pain, palpitations, orthopnea and PND.  Gastrointestinal: Negative for nausea, vomiting, abdominal pain, diarrhea, constipation and blood in stool.  Genitourinary: Negative for dysuria and frequency.  Musculoskeletal: Negative for myalgias, back pain, joint pain and neck pain.  Skin: Negative for rash.  Neurological: Negative for dizziness, focal weakness, loss of consciousness and headaches.  Endo/Heme/Allergies: Does not bruise/bleed easily.  Psychiatric/Behavioral: Negative for depression and hallucinations. The  patient is not nervous/anxious.     MEDICATIONS AT HOME:   Prior to Admission medications   Medication Sig Start Date End Date Taking? Authorizing Provider  allopurinol (ZYLOPRIM) 100 MG tablet Take 100 mg by mouth daily.   Yes Historical Provider, MD  amiodarone (PACERONE) 200 MG tablet Take 200 mg by mouth at bedtime.    Yes Historical Provider, MD  amLODipine (NORVASC) 5 MG tablet Take 5 mg by  mouth 2 (two) times daily.   Yes Historical Provider, MD  Calcium Carbonate-Vitamin D (CALCIUM 600+D) 600-400 MG-UNIT per tablet Take 1 tablet by mouth 2 (two) times daily.    Yes Historical Provider, MD  diltiazem (DILACOR XR) 120 MG 24 hr capsule Take 120 mg by mouth at bedtime.    Yes Historical Provider, MD  furosemide (LASIX) 20 MG tablet Take 20 mg by mouth daily.    Yes Historical Provider, MD  gemfibrozil (LOPID) 600 MG tablet Take 600 mg by mouth 2 (two) times daily before a meal.   Yes Historical Provider, MD  ipratropium-albuterol (DUONEB) 0.5-2.5 (3) MG/3ML SOLN Take 3 mLs by nebulization every 4 (four) hours as needed. Patient taking differently: Take 3 mLs by nebulization every 4 (four) hours as needed (for shortness of breath).  11/24/14  Yes Katharina Caper, MD  metoprolol succinate (TOPROL-XL) 25 MG 24 hr tablet Take 25 mg by mouth at bedtime.    Yes Historical Provider, MD  pantoprazole (PROTONIX) 40 MG tablet Take 40 mg by mouth at bedtime.    Yes Historical Provider, MD  vitamin B-12 (CYANOCOBALAMIN) 1000 MCG tablet Take 1,000 mcg by mouth daily.   Yes Historical Provider, MD  warfarin (COUMADIN) 5 MG tablet Take 2.5-5 mg by mouth every evening. Pt takes one tablet on Tuesday and one-half tablet all other days.   Yes Historical Provider, MD  warfarin (COUMADIN) 7.5 MG tablet Take 1 tablet (7.5 mg total) by mouth one time only at 6 PM. Take once a 11/24/2014, then resume your usual dosing Patient not taking: Reported on Dec 31, 2014 11/24/14   Katharina Caper, MD      VITAL SIGNS:  Blood pressure 134/64, pulse 62, temperature 98.6 F (37 C), resp. rate 19, height 5\' 3"  (1.6 m), weight 74.844 kg (165 lb), SpO2 93 %.  PHYSICAL EXAMINATION:  GENERAL:  81 y.o.-year-old patient lying in the bed with no acute distress.  EYES: Pupils equal, round, reactive to light and accommodation. No scleral icterus. Extraocular muscles intact.  HEENT: Head atraumatic, normocephalic. Oropharynx and  nasopharynx clear. Mucous membranes are moist, dentures in place NECK:  Supple, no jugular venous distention. No thyroid enlargement, no tenderness.  LUNGS: Scattered crackles, fair air movement, no wheezes or rhonchi, no respiratory distress CARDIOVASCULAR: S1, S2 normal. No murmurs, rubs, or gallops. 5/6 systolic ejection murmur, irregular ABDOMEN: Soft, nontender, nondistended. Bowel sounds present. No organomegaly or mass.  EXTREMITIES: Trace pedal edema bilaterally, no cyanosis, or clubbing.  NEUROLOGIC: Cranial nerves II through XII are intact. Muscle strength 5/5 in all extremities. Sensation intact. Gait not checked.  PSYCHIATRIC: The patient is alert and oriented x 3. Calm SKIN: No obvious rash, lesion, or ulcer.   LABORATORY PANEL:   CBC  Recent Labs Lab 2014/12/31 1219  WBC 2.6*  HGB 7.6*  HCT 23.9*  PLT 250   ------------------------------------------------------------------------------------------------------------------  Chemistries   Recent Labs Lab 31-Dec-2014 1219  NA 141  K 4.0  CL 99*  CO2 33*  GLUCOSE 77  BUN 23*  CREATININE 1.49*  CALCIUM  9.5  AST 29  ALT 16  ALKPHOS 92  BILITOT 0.6   ------------------------------------------------------------------------------------------------------------------  Cardiac Enzymes  Recent Labs Lab January 09, 2015 1219  TROPONINI <0.03   ------------------------------------------------------------------------------------------------------------------  RADIOLOGY:  Dg Chest Port 1 View  2015/01/09   CLINICAL DATA:  Shortness of breath for 2 days, unable to take deep inspiration, history COPD, CHF, hypertension, atrial fibrillation  EXAM: PORTABLE CHEST - 1 VIEW  COMPARISON:  Portable exam 1224 hours compared to 11/21/2014  FINDINGS: Borderline enlargement of cardiac silhouette.  Atherosclerotic calcification aorta.  BILATERAL airspace infiltrates increased on LEFT since previous exam favoring multifocal pneumonia.   Bibasilar atelectasis.  No gross pleural effusion or pneumothorax.  IMPRESSION: Pulmonary infiltrates bilaterally consistent with multifocal pneumonia increased since 11/21/2014.  Bibasilar atelectasis.   Electronically Signed   By: Ulyses Southward M.D.   On: 01-09-15 12:49    EKG:   Orders placed or performed during the hospital encounter of 01-09-2015  . EKG 12-Lead  . EKG 12-Lead   Normal sinus  IMPRESSION AND PLAN:   Principal Problem:   HCAP (healthcare-associated pneumonia) Active Problems:   CHF (congestive heart failure)   HTN (hypertension)   A-fib   Pulmonary fibrosis   Acute on chronic respiratory failure with hypoxia  #1 healthcare associated pneumonia: She was discharged June 15 from [facility] after treatment for community-acquired pneumonia. She was doing well after her discharge until about 1 week ago. Chest x-ray now with a multifocal pneumonia. Blood and sputum cultures are pending. She is being treated with broad spectrum anabiotic's with vancomycin and Zosyn.  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 She follows with Dr. Meredeth Ide. She is currently requiring 4-1/2 L to keep oxygen saturations in the low 90s. She is not in respiratory distress when she is in the bed. We'll continue to treat pneumonia. Also with scheduled nebulizers, antibiotics, and Solu-Medrol.  #3 congestive heart failure: She has a history of diastolic heart failure with an preserved ejection fraction by 2-D echocardiogram in June 2016. As trace edema and some of her chest x-ray findings may represent pulmonary edema. Will provide one additional dose of Lasix today. Check daily weights. Low-sodium diet. Monitor ins and outs.  #4 atrial fibrillation: Current EKG shows normal sinus rhythm. Continue Coumadin per pharmacy consultation. Her INR is therapeutic. Continue amiodarone, diltiazem,  metoprolol.  #5 hypertension: Blood pressure well controlled at this time. Continue with amlodipine, diltiazem, metoprolol and Lasix.  #6 GERD: Continue PPI  #7 gout: No sign of gout flare at this time. Continue allopurinol  #8 chronic kidney disease stage III: Stable. Monitor renal function with additional dose of Lasix.  #9 anemia: chronic anemia of chronic disease. Has had a drop in hgb from 9.7 >> 7.6 since June 12. Guiac stool and monitor. Continue PPI. Has had mild hemoptysis, but no other observed bleeding. Continue B12.  #10 prophylaxis: She is on warfarin with a therapeutic INR and already on a PPI  All the records are reviewed and case discussed with ED provider. Management plans discussed with the patient and her daughter Rosey Bath on admission.   CODE STATUS: Limited code. DNI. She states that she would want CPR and ACLS medications if needed.  TOTAL TIME TAKING CARE OF THIS PATIENT: 50 minutes. Greater than 50% of time spent in coordination of care and counseling.   Elby Showers M.D on 01/09/15 at 1:53 PM  Between 7am to 6pm - Pager - [phone]  After 6pm go to [email or web address] - password [email or web address] [facility]  [place] [facility]  Office  [phone]  CC: Primary care physician; Hillery Aldo, MD

## 2014-12-16 LAB — BASIC METABOLIC PANEL
Anion gap: 11 (ref 5–15)
BUN: 33 mg/dL — ABNORMAL HIGH (ref 6–20)
CALCIUM: 9.7 mg/dL (ref 8.9–10.3)
CHLORIDE: 99 mmol/L — AB (ref 101–111)
CO2: 34 mmol/L — ABNORMAL HIGH (ref 22–32)
Creatinine, Ser: 1.71 mg/dL — ABNORMAL HIGH (ref 0.44–1.00)
GFR calc Af Amer: 30 mL/min — ABNORMAL LOW (ref >60)
GFR calc non Af Amer: 26 mL/min — ABNORMAL LOW (ref >60)
GLUCOSE: 160 mg/dL — AB (ref 65–99)
Potassium: 4.3 mmol/L (ref 3.5–5.1)
Sodium: 144 mmol/L (ref 135–145)

## 2014-12-16 LAB — CBC
HCT: 23.5 % — ABNORMAL LOW (ref 35.0–47.0)
HEMOGLOBIN: 7.8 g/dL — AB (ref 12.0–16.0)
MCH: 30.3 pg (ref 26.0–34.0)
MCHC: 33.2 g/dL (ref 32.0–36.0)
MCV: 91.2 fL (ref 80.0–100.0)
Platelets: 264 10*3/uL (ref 150–440)
RBC: 2.57 MIL/uL — ABNORMAL LOW (ref 3.80–5.20)
RDW: 15.3 % — ABNORMAL HIGH (ref 11.5–14.5)
WBC: 2.4 10*3/uL — ABNORMAL LOW (ref 3.6–11.0)

## 2014-12-16 LAB — PROTIME-INR
INR: 2.81
PROTHROMBIN TIME: 29.7 s — AB (ref 11.4–15.0)

## 2014-12-16 LAB — OCCULT BLOOD X 1 CARD TO LAB, STOOL: Fecal Occult Bld: NEGATIVE

## 2014-12-16 MED ORDER — AMLODIPINE BESYLATE 5 MG PO TABS
5.0000 mg | ORAL_TABLET | Freq: Every day | ORAL | Status: DC
  Administered 2014-12-17 – 2014-12-19 (×3): 5 mg via ORAL
  Filled 2014-12-16 (×3): qty 1

## 2014-12-16 MED ORDER — METHYLPREDNISOLONE SODIUM SUCC 125 MG IJ SOLR
60.0000 mg | Freq: Two times a day (BID) | INTRAMUSCULAR | Status: DC
  Administered 2014-12-16 – 2014-12-20 (×8): 60 mg via INTRAVENOUS
  Filled 2014-12-16 (×8): qty 2

## 2014-12-16 MED ORDER — WARFARIN SODIUM 5 MG PO TABS
2.5000 mg | ORAL_TABLET | Freq: Once | ORAL | Status: AC
  Administered 2014-12-16: 2.5 mg via ORAL
  Filled 2014-12-16: qty 1

## 2014-12-16 NOTE — Plan of Care (Signed)
Problem: Discharge Progression Outcomes Goal: Other Discharge Outcomes/Goals Outcome: Progressing Plan of care progress to care: Pain - pt complains of no pain this shift Hemodynamically stable - vitals stable this shift Complications - none noted Diet - tolerating well Activity - pt 1 person standby assist

## 2014-12-16 NOTE — Progress Notes (Addendum)
[facility] - [place] at [facility]   PATIENT NAME: Stefanie Bryan    MR#:  [id number]  DATE OF BIRTH:  10/10/1927  SUBJECTIVE:  Feels some better than y'day. desats easily on 5-6 liter [place]  REVIEW OF SYSTEMS:   Review of Systems  Constitutional: Negative for fever, chills and weight loss.  HENT: Negative for ear discharge, ear pain and nosebleeds.   Eyes: Negative for blurred vision, pain and discharge.  Respiratory: Positive for cough, sputum production and shortness of breath. Negative for wheezing and stridor.   Cardiovascular: Negative for chest pain, palpitations, orthopnea and PND.  Gastrointestinal: Negative for nausea, vomiting, abdominal pain and diarrhea.  Genitourinary: Negative for urgency and frequency.  Musculoskeletal: Negative for back pain and joint pain.  Neurological: Positive for weakness. Negative for sensory change, speech change and focal weakness.  Psychiatric/Behavioral: Negative for depression. The patient is not nervous/anxious.   All other systems reviewed and are negative.  Tolerating Diet:yes Tolerating PT: eval pending  DRUG ALLERGIES:   Allergies  Allergen Reactions  . Adhesive [Tape] Rash  . Sulfa Antibiotics Rash    VITALS:  Blood pressure 111/45, pulse 73, temperature 98.2 F (36.8 C), temperature source Oral, resp. rate 20, height 5\' 3"  (1.6 m), weight 74.163 kg (163 lb 8 oz), SpO2 96 %.  PHYSICAL EXAMINATION:   Physical Exam  GENERAL:  81 y.o.-year-old patient lying in the bed with no acute distress.  EYES: Pupils equal, round, reactive to light and accommodation. No scleral icterus. Extraocular muscles intact.  HEENT: Head atraumatic, normocephalic. Oropharynx and nasopharynx clear.  NECK:  Supple, no jugular venous distention. No thyroid enlargement, no tenderness.  LUNGS: Coarse breath sounds bilaterally, no wheezing,positive rales and rhonchi. No use of accessory muscles of respiration.   CARDIOVASCULAR: S1, S2 normal. No murmurs, rubs, or gallops.  ABDOMEN: Soft, nontender, nondistended. Bowel sounds present. No organomegaly or mass.  EXTREMITIES: No cyanosis, clubbing or edema b/l.    NEUROLOGIC: Cranial nerves II through XII are intact. No focal Motor or sensory deficits b/l.  Subjective weakness + PSYCHIATRIC: The patient is alert and oriented x 3.  SKIN: No obvious rash, lesion, or ulcer.    LABORATORY PANEL:   CBC  Recent Labs Lab 12/16/14 0514  WBC 2.4*  HGB 7.8*  HCT 23.5*  PLT 264    Chemistries   Recent Labs Lab 2014-08-04 1219 12/16/14 0514  NA 141 144  K 4.0 4.3  CL 99* 99*  CO2 33* 34*  GLUCOSE 77 160*  BUN 23* 33*  CREATININE 1.49* 1.71*  CALCIUM 9.5 9.7  AST 29  --   ALT 16  --   ALKPHOS 92  --   BILITOT 0.6  --     Cardiac Enzymes  Recent Labs Lab 2014-08-04 1219  TROPONINI <0.03    RADIOLOGY:  Dg Chest Port 1 View  12/31/2014   CLINICAL DATA:  Shortness of breath for 2 days, unable to take deep inspiration, history COPD, CHF, hypertension, atrial fibrillation  EXAM: PORTABLE CHEST - 1 VIEW  COMPARISON:  Portable exam 1224 hours compared to 11/21/2014  FINDINGS: Borderline enlargement of cardiac silhouette.  Atherosclerotic calcification aorta.  BILATERAL airspace infiltrates increased on LEFT since previous exam favoring multifocal pneumonia.  Bibasilar atelectasis.  No gross pleural effusion or pneumothorax.  IMPRESSION: Pulmonary infiltrates bilaterally consistent with multifocal pneumonia increased since 11/21/2014.  Bibasilar atelectasis.   Electronically Signed   By: Ulyses SouthwardMark  Boles M.D.   On: 10/05/2014 12:49  ASSESSMENT AND PLAN:  81 y.o. female with a known history of pulmonary fibrosis with chronic respiratory failure requiring 3 L of nasal cannula at all times, coronary artery disease, diastolic congestive heart failure, recent admission from June 12 to June 15 for community-acquired pneumonia who presents today with worsening  shortness of breath, cough, sputum and hemoptysis   #1Suspected  healthcare associated pneumonia: She was discharged June 15 from [facility] after treatment for community-acquired pneumonia. She was doing well after her discharge until about 1 week ago. Chest x-ray now with a multifocal pneumonia. Blood and sputum cultures are pending.  -IV vancomycin and Zosyn.  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  -Dr. Meredeth Ide to see pt. -She is currently requiring 4-1/2 L to keep oxygen saturations in the low 90s. She is not in respiratory distress when she is in the bed. Easily desats. Use HFNC if needed -cont with scheduled nebulizers, antibiotics, and Solu-Medrol.  #3 congestive heart failure: She has a history of diastolic heart failure with an preserved ejection fraction by 2-D echocardiogram in June 2016. As trace edema and some of her chest x-ray findings may represent pulmonary edema.  -cont home dose lasix  #4 atrial fibrillation: Current EKG shows normal sinus rhythm. -Continue Coumadin per pharmacy consultation. Her INR is therapeutic. - Continue amiodarone, diltiazem, metoprolol.  #5 hypertension: Blood pressure well controlled at this time. Continue with amlodipine, diltiazem, metoprolol and Lasix.  #6 GERD: Continue PPI  #7 gout: No sign of gout flare at this time. Continue allopurinol  #8 chronic kidney disease stage III: Stable. Monitor renal function with additional dose of Lasix.  #9 anemia: chronic anemia of chronic disease. Has had a drop in hgb from 9.7 >> 7.6 since June 12.  - Continue PPI.  #10 DVT prophylaxis: She is on warfarin with a therapeutic INR   Management plans discussed with the patient  Spoke with Dr Meredeth Ide  CODE STATUS: Limited code. DNI. She states that she would want CPR and ACLS medications if needed. NO VENTILATOR  CODE STATUS:  Limited   TOTAL TIME TAKING CARE OF THIS PATIENT: 35 minutes.  >50% time spent on counselling and coordination of care  D/C DEPENDING ON CLINICAL CONDITION.   Dietra Stokely M.D on 12/16/2014 at 1:04 PM  Between 7am to 6pm - Pager - [phone]  After 6pm go to [email or web address] - password [email or web address] [facility]  [place] [facility]  Office  [phone]  CC: Primary care physician; Hillery Aldo, MD

## 2014-12-16 NOTE — Progress Notes (Signed)
Pt diastolic b/p in the 40's. Heart rate in the 50's. Pt has an evening dose of Pacerone, Norvasc, Metoprolol and Dilitazem. Notified Dr Anne HahnWillis via telephone. Dr Anne HahnWillis placed order to hold this evenings dose of Metoprolol, Dilitazem and changed Norvasc to qd. Will continue to monitor.Geri Seminolerusilla A Jackson, RN

## 2014-12-16 NOTE — Plan of Care (Signed)
Problem: Discharge Progression Outcomes Goal: Discharge plan in place and appropriate Individualization pt from home. LIVES BY SELF/ HOME O2 AT 3 L . GOOD FAMILY SUPPORT WITH DTR. Pt Was in last month. Hx of a fib Controlled with meds. Was being treated With po meds outpt for resp issues that did not work.  Outcome: Not Progressing Possibly   Pt will need  5-6 more days   Per  md.

## 2014-12-16 NOTE — Plan of Care (Signed)
Problem: Discharge Progression Outcomes Goal: Pain controlled with appropriate interventions Outcome: Progressing No c/o pain.  Goal: Hemodynamically stable Outcome: Not Progressing sats cont to drop  Low 80s when up oob  To br. 02 in use at 4 l at present. Tried to wean down to 3l  Like at home  But cant at present time. Goal: Complications resolved/controlled Outcome: Not Progressing See above note Goal: Tolerating diet Outcome: Progressing Pt tolerating diet well.  Goal: Activity appropriate for discharge plan Outcome: Progressing Up oob with standby assist.dyspnea on exertion. Goal: Other Discharge Outcomes/Goals Outcome: Not Progressing Discharge plans still pending.

## 2014-12-16 NOTE — Progress Notes (Addendum)
Date: 12/16/2014,   MRN# [id number] Stefanie Bryan 1927/09/12 Code Status:     Code Status Orders        Start     Ordered   2014/12/28 1441  Limited resuscitation (code)   Continuous    Question Answer Comment  In the event of cardiac or respiratory ARREST: Initiate Code Blue, Call Rapid Response Yes   In the event of cardiac or respiratory ARREST: Perform CPR Yes   In the event of cardiac or respiratory ARREST: Perform Intubation/Mechanical Ventilation No   In the event of cardiac or respiratory ARREST: Use NIPPV/BiPAp only if indicated Yes   In the event of cardiac or respiratory ARREST: Administer ACLS medications if indicated Yes   In the event of cardiac or respiratory ARREST: Perform Defibrillation or Cardioversion if indicated Yes      Dec 28, 2014 1440     Hosp day:@LENGTHOFSTAYDAYS @ Referring MD: @     PCP:      AdmissionWeight: 165 lb (74.844 kg)                 CurrentWeight: 163 lb 8 oz (74.163 kg)   CC: sob, hypoxia, multifocal pneumonia  Stefanie Bryan:WJXB is an 81 yo lady, hx of scleroderma, pulmonary fibrosis, copd, past hx of chf, obesity, hypoxia on oxygen, diaphrgm weakness, bronchiectasis,  afib on amiodarone, diltiazem and coumidin. .   She came to the ED because of shortness of breath became "so bad"  that she was concerned. On work up she was found to have  left pneumonia on chest x-ray. Hospitalists were called for admission for community-acquired pneumonia  And pulmonary consulted. Initially was on six liters o2, down to 4 liters when I saw her.    PMHX:   Past Medical History  Diagnosis Date  . Hypertension   . Atrial fibrillation   . GERD (gastroesophageal reflux disease)   . Anginal pain   . Heart murmur   . Shortness of breath dyspnea   . Chronic kidney disease   . COPD (chronic obstructive pulmonary disease)     Pulmonary fibrosis possibly due to scleroderma, follows with Dr. Meredeth Ide  . Coronary artery disease     Follows with Dr. Juliann Pares  .  CHF (congestive heart failure)     Diastolic heart failure, last echo June 2016 with ejection fraction 70%   Surgical Hx:  Past Surgical History  Procedure Laterality Date  . Abdominal hysterectomy    . Tubal ligation    . Cholecystectomy    . Knee surgery      right and left  . Coronary angioplasty     Family Hx:  Family History  Problem Relation Age of Onset  . Diverticulitis Brother    Social Hx:   History  Substance Use Topics  . Smoking status: Never Smoker   . Smokeless tobacco: Never Used  . Alcohol Use: No   Medication:    Home Medication:  No current outpatient prescriptions on file.  Current Medication: @   Allergies:  Adhesive and Sulfa antibiotics  Review of Systems: Gen:  Denies  fever, sweats, chills HEENT: Denies blurred vision, double vision, ear pain, eye pain, hearing loss, nose bleeds, sore throat Cvc:  No dizziness, chest pain or heaviness Resp:  Still quite dyspneic with min activity, no wheezing, min cough  Gi: Denies swallowing difficulty, stomach pain, nausea or vomiting, diarrhea, constipation, bowel incontinence Gu:  Denies bladder incontinence, burning urine Ext:   No Joint pain, stiffness or swelling Skin: No  skin rash, easy bruising or bleeding or hives Endoc:  No polyuria, polydipsia , polyphagia or weight change Psych: No depression, insomnia or hallucinations  Other:  All other systems negative  Physical Examination:   VS: BP 111/45 mmHg  Pulse 73  Temp(Src) 98.2 F (36.8 C) (Oral)  Resp 20  Ht 5\' 3"  (1.6 m)  Wt 163 lb 8 oz (74.163 kg)  BMI 28.97 kg/m2  SpO2 95%  LMP  (LMP Unknown)  General Appearance: mild distress, just back from the bath room, on 4 liters [place] 02  Neuro/psych without focal findings, mental status, speech normal, alert and oriented, cranial nerves 2-12 intact, reflexes normal and symmetric, sensation grossly normal  HEENT: PERRLA, EOM intact, no ptosis, no other lesions noticed Neck: Supple, no  stridor, nodes or jvd  Pulmonary:.No wheezing, No rales, crackles present  Sputum Production:   Cardiovascular:  Normal S1,S2. Systolic murmur present.     Abdomen:Benign, Soft, non-tender, No masses, hepatosplenomegaly, No lymphadenopathy Endoc: No evident thyromegaly, no signs of acromegaly or Cushing features Skin:   warm, no rashes, no ecchymosis  Extremities: normal, no cyanosis, clubbing, no edema, warm with normal capillary refill.    Labs results:   Recent Labs     01/02/2015  1219  12/16/14  0514  HGB  7.6*  7.8*  HCT  23.9*  23.5*  MCV  92.8  91.2  WBC  2.6*  2.4*  BUN  23*  33*  CREATININE  1.49*  1.71*  GLUCOSE  77  160*  CALCIUM  9.5  9.7  INR  2.74  2.81  ,  CLINICAL DATA: Shortness of breath for 2 days, unable to take deep inspiration, history COPD, CHF, hypertension, atrial fibrillation  EXAM: PORTABLE CHEST - 1 VIEW  COMPARISON: Portable exam 1224 hours compared to 11/21/2014  FINDINGS: Borderline enlargement of cardiac silhouette.  Atherosclerotic calcification aorta.  BILATERAL airspace infiltrates increased on LEFT since previous exam favoring multifocal pneumonia.  Bibasilar atelectasis.  No gross pleural effusion or pneumothorax.  IMPRESSION: Pulmonary infiltrates bilaterally consistent with multifocal pneumonia increased since 11/21/2014.  SPIROMETRY: FVC was 1.40 liters, 63% of predicted FEV1 was 1.12, 72% of predicted FEV1 ratio was 80 FEF 25-75% liters per second was 85% of predicted  LUNG VOLUMES: TLC was 67% of predicted RV was 71% of predicted  DIFFUSION CAPACITY: DLCO was 52% of predicted DLCO/VA was 110% of predicted  FLOW VOLUME LOOP: normal  SUMMARY: Findings c/w mild-moderate restriction,   *Compared to Previous Study, DLCO is declined >10%, but also looking at almost 4.5 years. Previous Study is prior to Amiodarone use  Assessment and Plan:  This is an 81 yo female somewhat overweight, came in with  shortness of breath with an underlying  diagnosis of scleroderma who has had a chest CT scan that showed basilar interstitial changes. The question was, is this all part of her scleroderma.  PFTS c/w restriction secondary to her over weight and interstitial changes (fibrosis). On chest ct there is also mild bronchiectasis. She also has diaphragmatic paralysis or partial paralysis.Prior to admission. I thought the above explained her sob. However, in addition to the above she also has advancing bilateral infitrates ( more so on the left): ? recurrent Pneumonia, agree with zosyn and vanco, IPF flare vs developing amiodarone toxicity (doubt the later but cannot rule out), she is on solumedrol as discussed vs pulmonary edema. Will check BNP, echo, monitor I's and o's.  Sed rate, notify rheum, serial cxr, Dr. Welton FlakesKhan following  over the weekend.  I have personally obtained a history, examined the patient, evaluated laboratory and imaging results, formulated the assessment and plan and placed orders.  The Patient requires high complexity decision making for assessment and support, frequent evaluation and titration of therapies, application of advanced monitoring technologies and extensive interpretation of multiple databases.   Marijke Guadiana,M.D. Pulmonary & Critical care Medicine [facility]

## 2014-12-17 LAB — PROTIME-INR
INR: 3.68
PROTHROMBIN TIME: 36.5 s — AB (ref 11.4–15.0)

## 2014-12-17 LAB — CREATININE, SERUM
Creatinine, Ser: 1.85 mg/dL — ABNORMAL HIGH (ref 0.44–1.00)
GFR, EST AFRICAN AMERICAN: 28 mL/min — AB (ref >60)
GFR, EST NON AFRICAN AMERICAN: 24 mL/min — AB (ref >60)

## 2014-12-17 LAB — C DIFFICILE QUICK SCREEN W PCR REFLEX
C DIFFICLE (CDIFF) ANTIGEN: POSITIVE
C Diff toxin: NEGATIVE

## 2014-12-17 LAB — BRAIN NATRIURETIC PEPTIDE: B Natriuretic Peptide: 188 pg/mL — ABNORMAL HIGH (ref 0.0–100.0)

## 2014-12-17 LAB — CLOSTRIDIUM DIFFICILE BY PCR: CDIFFPCR: POSITIVE — AB

## 2014-12-17 MED ORDER — WARFARIN SODIUM 5 MG PO TABS: 2.5000 mg | ORAL_TABLET | Freq: Every day | ORAL | Status: DC

## 2014-12-17 MED ORDER — LOPERAMIDE HCL 2 MG PO CAPS
2.0000 mg | ORAL_CAPSULE | ORAL | Status: DC | PRN
  Administered 2014-12-17: 2 mg via ORAL
  Filled 2014-12-17: qty 1

## 2014-12-17 NOTE — Plan of Care (Signed)
Problem: Discharge Progression Outcomes Goal: Hemodynamically stable Outcome: Not Progressing Desaturation. Hypotensive. Some cardiac meds held per MD order at bedtime.  Goal: Other Discharge Outcomes/Goals Outcome: Not Progressing Pt still desats significantly with ambulation. O2 adjusted accordingly. No c/o pain. Up with assist to BR.

## 2014-12-17 NOTE — Care Management (Signed)
Admitted to [facility] with the diagnosis of pneumonia. Discharged from this facility 11/24/14. Granddaughter lives with her. Daughter is Rosey Batheresa [phone]). Seen by Drs Tyrone SageSally Patel and [place]. Home oxygen thru [facility]. Home Health services thru [facility] in the past. No Skilled nursing facility. Takes care of all instrumental and [place] activities of daily living herself. Physical therapy evaluation ordered.  Gwenette GreetBrenda S Holland RN MSN Care Management [phone]

## 2014-12-17 NOTE — Progress Notes (Signed)
PT Cancellation Note  Patient Details Name: Stefanie Bryan MRN: [id number] DOB: [birth year]   Cancelled Treatment:    Reason Eval/Treat Not Completed: Medical issues which prohibited therapy (Nursing reports pt switched to HFNC today and not having a good day (in terms of O2 and SOB with activity) and recommends holding PT today.)  D/t to this, will hold PT today and re-attempt PT eval at a later date/time as able/appropriate.   Irving Burtonmily Dajuana Palen 12/17/2014, 2:48 PM Hendricks LimesEmily Amore Ackman, PT [phone]

## 2014-12-17 NOTE — Progress Notes (Signed)
RT called to room to assess patient in respiratory distress with O2 sat 79% on 4LPM [place].  RN increased to 5LPM [place] and patient sat up to 84%.  RT increased to 6LPM , patient sat up to 85%.  Per order from Dr. Enedina FinnerSona Patel, patient was placed on HFNC by RT at 60% at 50LPM.  Patient tolerating well and O2 sat up to 95%.  Will continue to monitor patient.

## 2014-12-17 NOTE — Plan of Care (Signed)
Problem: Discharge Progression Outcomes Goal: Other Discharge Outcomes/Goals Outcome: Progressing Plan of care progress: Pneumonia: -continues IV ABTs -5L [place] changed to HFNC related to desating, imporved with HFNC -continues duonebs -no complaints of pain, no distress or discomfort noted

## 2014-12-17 NOTE — Progress Notes (Signed)
[facility] - [place] at [facility]   PATIENT NAME: Stefanie Bryan    MR#:  [id number]  DATE OF BIRTH:  [birth year]  SUBJECTIVE:  Feels some better than y'day. Now on 4 liter [place]  REVIEW OF SYSTEMS:   Review of Systems  Constitutional: Negative for fever, chills and weight loss.  HENT: Negative for ear discharge, ear pain and nosebleeds.   Eyes: Negative for blurred vision, pain and discharge.  Respiratory: Positive for cough, sputum production and shortness of breath. Negative for wheezing and stridor.   Cardiovascular: Negative for chest pain, palpitations, orthopnea and PND.  Gastrointestinal: Negative for nausea, vomiting, abdominal pain and diarrhea.  Genitourinary: Negative for urgency and frequency.  Musculoskeletal: Negative for back pain and joint pain.  Neurological: Positive for weakness. Negative for sensory change, speech change and focal weakness.  Psychiatric/Behavioral: Negative for depression. The patient is not nervous/anxious.   All other systems reviewed and are negative.  Tolerating Diet:yes Tolerating PT: eval pending  DRUG ALLERGIES:   Allergies  Allergen Reactions  . Adhesive [Tape] Rash  . Sulfa Antibiotics Rash    VITALS:  Blood pressure 116/60, pulse 78, temperature 97.8 F (36.6 C), temperature source Oral, resp. rate 20, height  (1.6 m), weight 70.761 kg (156 lb), SpO2 91 %.  PHYSICAL EXAMINATION:   Physical Exam  GENERAL:  81 y.o.-year-old patient lying in the bed with no acute distress.  EYES: Pupils equal, round, reactive to light and accommodation. No scleral icterus. Extraocular muscles intact.  HEENT: Head atraumatic, normocephalic. Oropharynx and nasopharynx clear.  NECK:  Supple, no jugular venous distention. No thyroid enlargement, no tenderness.  LUNGS: Coarse breath sounds bilaterally, no wheezing,positive rales and rhonchi. No use of accessory muscles of respiration.  CARDIOVASCULAR: S1, S2  normal. No murmurs, rubs, or gallops.  ABDOMEN: Soft, nontender, nondistended. Bowel sounds present. No organomegaly or mass.  EXTREMITIES: No cyanosis, clubbing or edema b/l.    NEUROLOGIC: Cranial nerves II through XII are intact. No focal Motor or sensory deficits b/l.  Subjective weakness + PSYCHIATRIC: The patient is alert and oriented x 3.  SKIN: No obvious rash, lesion, or ulcer.    LABORATORY PANEL:   CBC  Recent Labs Lab 12/16/14 0514  WBC 2.4*  HGB 7.8*  HCT 23.5*  PLT 264    Chemistries   Recent Labs Lab 12/26/2014 1219 12/16/14 0514 12/17/14 0500  NA 141 144  --   K 4.0 4.3  --   CL 99* 99*  --   CO2 33* 34*  --   GLUCOSE 77 160*  --   BUN 23* 33*  --   CREATININE 1.49* 1.71* 1.85*  CALCIUM 9.5 9.7  --   AST 29  --   --   ALT 16  --   --   ALKPHOS 92  --   --   BILITOT 0.6  --   --     Cardiac Enzymes  Recent Labs Lab 01/07/2015 1219  TROPONINI <0.03    RADIOLOGY:  Dg Chest Port 1 View  01/06/2015   CLINICAL DATA:  Shortness of breath for 2 days, unable to take deep inspiration, history COPD, CHF, hypertension, atrial fibrillation  EXAM: PORTABLE CHEST - 1 VIEW  COMPARISON:  Portable exam 1224 hours compared to 11/21/2014  FINDINGS: Borderline enlargement of cardiac silhouette.  Atherosclerotic calcification aorta.  BILATERAL airspace infiltrates increased on LEFT since previous exam favoring multifocal pneumonia.  Bibasilar atelectasis.  No gross pleural effusion or  pneumothorax.  IMPRESSION: Pulmonary infiltrates bilaterally consistent with multifocal pneumonia increased since 11/21/2014.  Bibasilar atelectasis.   Electronically Signed   By: Ulyses SouthwardMark  Boles M.D.   On: 12-18-2014 12:49   ASSESSMENT AND PLAN:  81 y.o. female with a known history of pulmonary fibrosis with chronic respiratory failure requiring 3 L of nasal cannula at all times, coronary artery disease, diastolic congestive heart failure, recent admission from June 12 to June 15 for  community-acquired pneumonia who presents today with worsening shortness of breath, cough, sputum and hemoptysis   #1Suspected  healthcare associated pneumonia: [facility] was [facility] after treatment for community-acquired pneumonia. She was doing well after her discharge until about 1 week ago. Chest x-ray now with a multifocal pneumonia. Blood negative so far and sputum cultures are pending.  -IV vancomycin and Zosyn.  #2 acute on chronic respiratory failure with hypoxia: She is on 3 L via nasal cannula chronically due to pulmonary fibrosis  -Dr. Reita ClicheFleming's input noted -She is currently requiring 4  L to keep oxygen saturations in the low 90s. She is not in respiratory distress when she is in the bed. Easily desats. Use HFNC if needed -cont with scheduled nebulizers, antibiotics, and Solu-Medrol.  #3 congestive heart failure: She has a history of diastolic heart failure with an preserved ejection fraction by 2-D echocardiogram in June 2016. As trace edema and some of her chest x-ray findings may represent pulmonary edema.  -on lasix at home  #4 atrial fibrillation: Current EKG shows normal sinus rhythm. -Continue Coumadin per pharmacy consultation. Her INR is therapeutic. - Continue amiodarone, diltiazem, metoprolol.  #5 hypertension: Blood pressure well controlled at this time. Continue with amlodipine, diltiazem, metoprolol and Lasix.  #6 GERD: Continue PPI  #7 gout: No sign of gout flare at this time. Continue allopurinol  #8 chronic kidney disease stage III: Stable.  -creat rising 1.79. Hold lasix today  #9 anemia: chronic anemia of chronic disease. Has had a drop in hgb from 9.7 >> 7.6 since June 12.  - Continue PPI.  #10 DVT prophylaxis: She is on warfarin with a therapeutic INR   Management plans discussed with the patient  Spoke with Dr Meredeth IdeFleming  CODE STATUS: Limited code. DNI. She states that she would want CPR and ACLS medications if needed. NO  VENTILATOR  CODE STATUS: Limited   TOTAL TIME TAKING CARE OF THIS PATIENT: 35 minutes.  >50% time spent on counselling and coordination of care  D/C DEPENDING ON CLINICAL CONDITION.   Alleen Kehm M.D on 12/17/2014 at 9:12 AM  Between 7am to 6pm - Pager - [phone]  After 6pm go to [email or web address] - password [email or web address] [facility]  [facility]  Office  [phone]  CC: Primary care physician; Hillery AldoPATEL, SARAH, MD

## 2014-12-17 NOTE — Progress Notes (Signed)
ANTIBIOTIC CONSULT NOTE - FOLLOW UP  Pharmacy Consult for vancomycin/Zosyn Indication: pneumonia (HCAP)  Allergies  Allergen Reactions  . Adhesive [Tape] Rash  . Sulfa Antibiotics Rash    Patient Measurements: Height: 5\' 3"  (160 cm) Weight: 156 lb (70.761 kg) IBW/kg (Calculated) : 52.4 Adjusted Body Weight: 61.4 kg  Vital Signs: Temp: 97.8 F (36.6 C) (07/08 0534) Temp Source: Oral (07/08 0534) BP: 116/60 mmHg (07/08 0825) Pulse Rate: 78 (07/08 0825) Intake/Output from previous day: 07/07 0701 - 07/08 0700 In: 480 [P.O.:480] Out: 1000 [Urine:1000] Intake/Output from this shift: Total I/O In: 240 [P.O.:240] Out: 400 [Urine:400]  Labs:  Recent Labs  2014/09/19 1219 12/16/14 0514 12/17/14 0500  WBC 2.6* 2.4*  --   HGB 7.6* 7.8*  --   PLT 250 264  --   CREATININE 1.49* 1.71* 1.85*   Estimated Creatinine Clearance: 21.4 mL/min (by C-G formula based on Cr of 1.85). No results for input(s): VANCOTROUGH, VANCOPEAK, VANCORANDOM, GENTTROUGH, GENTPEAK, GENTRANDOM, TOBRATROUGH, TOBRAPEAK, TOBRARND, AMIKACINPEAK, AMIKACINTROU, AMIKACIN in the last 72 hours.   Microbiology: Recent Results (from the past 720 hour(s))  Culture, blood (routine x 2)     Status: None   Collection Time: 11/21/14  4:02 AM  Result Value Ref Range Status   Specimen Description BLOOD  Final   Special Requests Normal  Final   Culture NO GROWTH 5 DAYS  Final   Report Status 11/26/2014 FINAL  Final  Culture, blood (routine x 2)     Status: None   Collection Time: 11/21/14  4:02 AM  Result Value Ref Range Status   Specimen Description BLOOD  Final   Special Requests Normal  Final   Culture NO GROWTH 5 DAYS  Final   Report Status 11/26/2014 FINAL  Final  Culture, sputum-assessment     Status: None   Collection Time: 11/21/14 10:26 AM  Result Value Ref Range Status   Specimen Description SPUTUM  Final   Special Requests NONE  Final   Sputum evaluation   Final    Sputum specimen not acceptable for  testing.  Please recollect.   CALLED TO AMANDA,RN EXT [phone]     Report Status 11/21/2014 FINAL  Final  Blood Culture (routine x 2)     Status: None (Preliminary result)   Collection Time: 2014/09/19 12:10 PM  Result Value Ref Range Status   Specimen Description BLOOD LFA  Final   Special Requests BOTTLES DRAWN AEROBIC AND ANAEROBIC 2.5  Final   Culture NO GROWTH 2 DAYS  Final   Report Status PENDING  Incomplete  Blood Culture (routine x 2)     Status: None (Preliminary result)   Collection Time: 2014/09/19 12:19 PM  Result Value Ref Range Status   Specimen Description BLOOD RIGHT ANTECUBITAL  Final   Special Requests BOTTLES DRAWN AEROBIC AND ANAEROBIC 1/3  Final   Culture NO GROWTH 2 DAYS  Final   Report Status PENDING  Incomplete    Medical History: Past Medical History  Diagnosis Date  . Hypertension   . Atrial fibrillation   . GERD (gastroesophageal reflux disease)   . Anginal pain   . Heart murmur   . Shortness of breath dyspnea   . Chronic kidney disease   . COPD (chronic obstructive pulmonary disease)     Pulmonary fibrosis possibly due to scleroderma, follows with Dr. Meredeth IdeFleming  . Coronary artery disease     Follows with Dr. Juliann Paresallwood  . CHF (congestive heart failure)     Diastolic heart failure,  last echo June 2016 with ejection fraction 70%     Assessment: 81 yo female admitted with HCAP  BCx x2 pending Sputum cx pending  INitial PK:Ke 0.024, half life 29 h, Vd 43L Updated PK: Scr: 1.86 mg/dl;  Kel (hr-1): 0.981 Half-life (hrs): 34.66 Vd (liters): 49.56 (factor used: 0.7 L/kg)   Goal of Therapy:  Vancomycin trough level 15-20 mcg/ml  Plan:  Pt received Zosyn 3.375 g IV x1 in the ED at 1247.  Will continue dosing with Zosyn 3.375 g IV q8h EI   Pt received vancomycin 1 g IV x1 in the ED at 1253.  Will continue dosing with vancomycin 1 g IV q36 h despite renal functioning worsening.  Trough ordered prior to 3rd dose (will not be at steady state) - 7/10 at  0730 Will need to continue to monitor renal function and culture results   Haeli Gerlich D 12/17/2014,9:45 AM

## 2014-12-17 NOTE — Progress Notes (Signed)
Spoke to Dr Anne HahnWillis. Pt b/p 115/56. Pt has Pacerone, Metoprolol and Dilitazem scheduled for tonight. Dr Anne HahnWillis said to hold Metoprolol and Dilitazem tonight but give the Pacerone. Will continue to monitor. Geri Seminolerusilla A Jackson, RN

## 2014-12-17 NOTE — Progress Notes (Signed)
Pt with increase difficulty breathing, o2 sats 79%, breathing techniques utilized o2 sat increased to 88% Clarence, respiratory made aware pt change to HFNC o2 sat 95%

## 2014-12-17 NOTE — Care Management (Signed)
Important Message  Patient Details  Name: Stefanie Bryan MRN: [id number] Date of Birth: 05-17-1928   Medicare Important Message Given:  Yes-third notification given    Verita SchneidersKathy A Allmond 12/17/2014, 10:51 AM

## 2014-12-17 NOTE — Progress Notes (Signed)
ANTICOAGULATION CONSULT NOTE - Follow up   Pharmacy Consult for Coumadin Indication: atrial fibrillation  Allergies  Allergen Reactions  . Adhesive [Tape] Rash  . Sulfa Antibiotics Rash    Patient Measurements: Height: 5\' 3"  (160 cm) Weight: 156 lb (70.761 kg) IBW/kg (Calculated) : 52.4 Heparin Dosing Weight: n/a  Vital Signs: Temp: 97.8 F (36.6 C) (07/08 0534) Temp Source: Oral (07/08 0534) BP: 116/60 mmHg (07/08 0825) Pulse Rate: 78 (07/08 0825)  Labs:  Recent Labs  12/30/2014 1219 12/16/14 0514 12/17/14 0500  HGB 7.6* 7.8*  --   HCT 23.9* 23.5*  --   PLT 250 264  --   APTT 58*  --   --   LABPROT 29.1* 29.7* 36.5*  INR 2.74 2.81 3.68  CREATININE 1.49* 1.71* 1.85*  TROPONINI <0.03  --   --     Estimated Creatinine Clearance: 21.4 mL/min (by C-G formula based on Cr of 1.85).   Medical History: Past Medical History  Diagnosis Date  . Hypertension   . Atrial fibrillation   . GERD (gastroesophageal reflux disease)   . Anginal pain   . Heart murmur   . Shortness of breath dyspnea   . Chronic kidney disease   . COPD (chronic obstructive pulmonary disease)     Pulmonary fibrosis possibly due to scleroderma, follows with Dr. Meredeth IdeFleming  . Coronary artery disease     Follows with Dr. Juliann Paresallwood  . CHF (congestive heart failure)     Diastolic heart failure, last echo June 2016 with ejection fraction 70%    Medications:  Prescriptions prior to admission  Medication Sig Dispense Refill Last Dose  . allopurinol (ZYLOPRIM) 100 MG tablet Take 100 mg by mouth daily.   01/07/2015 at Unknown time  . amiodarone (PACERONE) 200 MG tablet Take 200 mg by mouth at bedtime.    12/14/2014 at Unknown time  . amLODipine (NORVASC) 5 MG tablet Take 5 mg by mouth 2 (two) times daily.   12/10/2014 at Unknown time  . Calcium Carbonate-Vitamin D (CALCIUM 600+D) 600-400 MG-UNIT per tablet Take 1 tablet by mouth 2 (two) times daily.    12/29/2014 at Unknown time  . diltiazem (DILACOR XR) 120 MG  24 hr capsule Take 120 mg by mouth at bedtime.    12/14/2014 at Unknown time  . furosemide (LASIX) 20 MG tablet Take 20 mg by mouth daily.    01/05/2015 at Unknown time  . gemfibrozil (LOPID) 600 MG tablet Take 600 mg by mouth 2 (two) times daily before a meal.   01/03/2015 at Unknown time  . ipratropium-albuterol (DUONEB) 0.5-2.5 (3) MG/3ML SOLN Take 3 mLs by nebulization every 4 (four) hours as needed. (Patient taking differently: Take 3 mLs by nebulization every 4 (four) hours as needed (for shortness of breath). ) 360 mL 2 12/25/2014 at Unknown time  . metoprolol succinate (TOPROL-XL) 25 MG 24 hr tablet Take 25 mg by mouth at bedtime.    12/14/2014 at 2130  . pantoprazole (PROTONIX) 40 MG tablet Take 40 mg by mouth at bedtime.    12/14/2014 at Unknown time  . vitamin B-12 (CYANOCOBALAMIN) 1000 MCG tablet Take 1,000 mcg by mouth daily.   12/11/2014 at Unknown time  . warfarin (COUMADIN) 5 MG tablet Take 2.5-5 mg by mouth every evening. Pt takes one tablet on Tuesday and one-half tablet all other days.   12/14/2014 at Unknown time  . warfarin (COUMADIN) 7.5 MG tablet Take 1 tablet (7.5 mg total) by mouth one time only at 6  PM. Take once a 11/24/2014, then resume your usual dosing (Patient not taking: Reported on 12/28/2014) 1 tablet 0     Assessment: Patient with history of a-fib to be continued on coumadin per pharmacy dosing. INR today is 2.74 and within therapeutic range. Home dosing of Coumadin is  on Tuesdays and 2.5mg  all other days.  INR = 3.68   Goal of Therapy:  INR 2-3 Monitor platelets by anticoagulation protocol: Yes   Plan:  Will hold warfarin for today due to INR =3.68. PT/INR with am labs.   Demetrius Charity, PharmD  12/17/2014 9:40 AM

## 2014-12-18 ENCOUNTER — Inpatient Hospital Stay: Payer: Medicare Other

## 2014-12-18 LAB — PROTIME-INR
INR: 3.55
Prothrombin Time: 35.5 seconds — ABNORMAL HIGH (ref 11.4–15.0)

## 2014-12-18 LAB — CREATININE, SERUM
Creatinine, Ser: 1.8 mg/dL — ABNORMAL HIGH (ref 0.44–1.00)
GFR calc non Af Amer: 25 mL/min — ABNORMAL LOW (ref >60)
GFR, EST AFRICAN AMERICAN: 29 mL/min — AB (ref >60)

## 2014-12-18 LAB — OCCULT BLOOD X 1 CARD TO LAB, STOOL: Fecal Occult Bld: POSITIVE — AB

## 2014-12-18 MED ORDER — METRONIDAZOLE IN NACL 5-0.79 MG/ML-% IV SOLN
500.0000 mg | Freq: Three times a day (TID) | INTRAVENOUS | Status: DC
  Administered 2014-12-18 – 2014-12-20 (×8): 500 mg via INTRAVENOUS
  Filled 2014-12-18 (×14): qty 100

## 2014-12-18 MED ORDER — MORPHINE SULFATE 2 MG/ML IJ SOLN
1.0000 mg | Freq: Once | INTRAMUSCULAR | Status: AC
  Administered 2014-12-18: 11:00:00 1 mg via INTRAVENOUS

## 2014-12-18 MED ORDER — MORPHINE SULFATE 2 MG/ML IJ SOLN
INTRAMUSCULAR | Status: AC
  Filled 2014-12-18: qty 1

## 2014-12-18 MED ORDER — MORPHINE SULFATE 2 MG/ML IJ SOLN
1.0000 mg | INTRAMUSCULAR | Status: DC | PRN
  Administered 2014-12-19: 1 mg via INTRAVENOUS
  Filled 2014-12-18: qty 1

## 2014-12-18 MED ORDER — FUROSEMIDE 20 MG PO TABS
20.0000 mg | ORAL_TABLET | Freq: Every day | ORAL | Status: DC
  Administered 2014-12-18 – 2014-12-19 (×2): 20 mg via ORAL
  Filled 2014-12-18 (×2): qty 1

## 2014-12-18 NOTE — Progress Notes (Signed)
PT Refusal Note  Patient Details Name: Stefanie Bryan MRN: [id number] DOB: 01/18/1928   Cancelled Treatment:    Reason Eval/Treat Not Completed: Patient declined, no reason specified. Attempted to evaluate patient. Pt reporting that she is too short of breath to participate with therapy at this time. Extensive encouragement provided along with importance of evaluation for discharge recommendations and to document cardiopulmonary status with activity. Pt continues to refuse. Will attempt evaluation on later date as pt is willing participate. RN encouraged to call PT if patient changes her mind and is willing to participate.   Sharalyn InkJason D Elijiah Mickley PT, DPT   Montserrat Shek 12/18/2014, 11:46 AM

## 2014-12-18 NOTE — Progress Notes (Signed)
Patient O2 saturation dropped to 76% while using bedpan.  Increaced WOB noted.  Dr. Anne HahnWillis notified and foley catheter ordered.

## 2014-12-18 NOTE — Plan of Care (Signed)
Problem: Discharge Progression Outcomes Goal: Tolerating diet Outcome: Not Progressing Pt NPO with bipap Goal: Activity appropriate for discharge plan Outcome: Not Progressing desats with any activity

## 2014-12-18 NOTE — Progress Notes (Signed)
Foley catheter inserted without difficulty per protocol.  Patient tolerated well.

## 2014-12-18 NOTE — Progress Notes (Signed)
Patient transferring to CCU due to shortness of breath. Respiratory placed patient on 100% Bipap. [facility] Bo McclintockBrewer,Marisol Giambra S, RN

## 2014-12-18 NOTE — Progress Notes (Addendum)
[facility] - [place] at [facility]   PATIENT NAME: Stefanie Bryan    MR#:  [id number]  DATE OF BIRTH:  Nov 02, 1927  SUBJECTIVE:  Not feeling too well. Worsening breathing and respiratory symptoms  REVIEW OF SYSTEMS:   Review of Systems  Constitutional: Negative for fever, chills and weight loss.  HENT: Negative for ear discharge, ear pain and nosebleeds.   Eyes: Negative for blurred vision, pain and discharge.  Respiratory: Positive for cough, sputum production and shortness of breath. Negative for wheezing and stridor.   Cardiovascular: Negative for chest pain, palpitations, orthopnea and PND.  Gastrointestinal: Negative for nausea, vomiting, abdominal pain and diarrhea.  Genitourinary: Negative for urgency and frequency.  Musculoskeletal: Negative for back pain and joint pain.  Neurological: Positive for weakness. Negative for sensory change, speech change and focal weakness.  Psychiatric/Behavioral: Negative for depression. The patient is not nervous/anxious.   All other systems reviewed and are negative.  Tolerating Diet:yes Tolerating PT: eval pending  DRUG ALLERGIES:   Allergies  Allergen Reactions  . Adhesive [Tape] Rash  . Sulfa Antibiotics Rash    VITALS:  Blood pressure 134/64, pulse 67, temperature 97.8 F (36.6 C), temperature source Oral, resp. rate 22, height  (1.6 m), weight 71.26 kg (157 lb 1.6 oz), SpO2 97 %.  PHYSICAL EXAMINATION:   Physical Exam  GENERAL:  81 y.o.-year-old patient lying in the bed with acute distress. Appears crttically ill EYES: Pupils equal, round, reactive to light and accommodation. No scleral icterus. Extraocular muscles intact.  HEENT: Head atraumatic, normocephalic. Oropharynx and nasopharynx clear.  NECK:  Supple, no jugular venous distention. No thyroid enlargement, no tenderness.  LUNGS: Coarse breath sounds bilaterally, no wheezing,positive rales and rhonchi. No use of accessory muscles of  respiration.  CARDIOVASCULAR: S1, S2 normal. No murmurs, rubs, or gallops.  ABDOMEN: Soft, nontender, nondistended. Bowel sounds present. No organomegaly or mass.  EXTREMITIES: No cyanosis, clubbing or edema b/l.    NEUROLOGIC: Cranial nerves II through XII are intact. No focal Motor or sensory deficits b/l.  Subjective weakness + PSYCHIATRIC: The patient is alert and oriented x 3.  SKIN: No obvious rash, lesion, or ulcer.    LABORATORY PANEL:   CBC  Recent Labs Lab 12/16/14 0514  WBC 2.4*  HGB 7.8*  HCT 23.5*  PLT 264    Chemistries   Recent Labs Lab 12/27/2014 1219 12/16/14 0514  12/18/14 0444  NA 141 144  --   --   K 4.0 4.3  --   --   CL 99* 99*  --   --   CO2 33* 34*  --   --   GLUCOSE 77 160*  --   --   BUN 23* 33*  --   --   CREATININE 1.49* 1.71*  < > 1.80*  CALCIUM 9.5 9.7  --   --   AST 29  --   --   --   ALT 16  --   --   --   ALKPHOS 92  --   --   --   BILITOT 0.6  --   --   --   < > = values in this interval not displayed.  Cardiac Enzymes  Recent Labs Lab 12/17/2014 1219  TROPONINI <0.03    RADIOLOGY:  Dg Chest Port 1 View  12/18/2014   CLINICAL DATA:  Hypoxia, history of pulmonary fibrosis with chronic respiratory failure, recent admission for community acquired pneumonia, now with worsening shortness of  breath, cough, and hemoptysis  EXAM: PORTABLE CHEST - 1 VIEW  COMPARISON:  2015-06-02  FINDINGS: Multifocal patchy opacities, upper lobe predominant, suspicious for multifocal pneumonia. This appearance has continued to progress from prior chest radiographs, particularly in the left upper lobe.  Underlying mild bibasilar scarring. No pleural effusion or pneumothorax.  The heart is normal in size.  IMPRESSION: Multifocal patchy opacities, upper lobe predominant, suspicious for multifocal pneumonia.  This appearance has continued to progress from prior chest radiographs, particularly in the left upper lobe.   Electronically Signed   By: Charline BillsSriyesh  Krishnan  M.D.   On: 12/18/2014 11:56   ASSESSMENT AND PLAN:  81 y.o. female with a known history of pulmonary fibrosis with chronic respiratory failure requiring 3 L of nasal cannula at all times, coronary artery disease, diastolic congestive heart failure, recent admission from June 12 to June 15 for community-acquired pneumonia who presents today with worsening shortness of breath, cough, sputum and hemoptysis   #1Suspected  healthcare associated pneumonia: [facility]. She was doing well after her discharge until about 1 week ago. Chest x-ray now with a multifocal pneumonia. Blood negative so far and sputum cultures are pending.  -IV vancomycin and Zosyn. -cxr shows worsening infitrates >ARDS/pneumonia  #2 acute on chronic respiratory failure with hypoxia: She is on 3 L via nasal cannula chronically due to pulmonary fibrosis  -Dr. Reita ClicheFleming's input noted -She is currently requiring high fio2to keep oxygen saturations in the low 90s. She is in respiratory distress when she is in the bed. Easily desats -will transfer to CCU for BIPAP use, prn morphine -cont with scheduled nebulizers, antibiotics, and Solu-Medrol.  #3 congestive heart failure: She has a history of diastolic heart failure with an preserved ejection fraction by 2-D echocardiogram in June 2016. As trace edema and some of her chest x-ray findings may represent pulmonary edema.  -on lasix at home  #4 atrial fibrillation: Current EKG shows normal sinus rhythm. -Continue Coumadin per pharmacy consultation. Her INR is therapeutic. - Continue diltiazem, metoprolol. -d/ced amiodarone  #5 hypertension: Blood pressure well controlled at this time. Continue with amlodipine, diltiazem, metoprolol and Lasix.  #6 GERD: Continue PPI  #7 gout: No sign of gout flare at this time. Continue allopurinol  #8 chronic kidney disease stage III: Stable.  -creat rising 1.79. Hold  lasix today  #9 anemia: chronic anemia of chronic disease. Has had a drop in hgb from 9.7 >> 7.6 since June 12.  - Continue PPI.  #10 DVT prophylaxis: She is on warfarin with a therapeutic INR  -overall appears decline in lung condition. Spoke with dter and aware for poor prognosis -Palliative care on Monday -?hospice  Management plans discussed with the patient  Spoke with Dr Meredeth IdeFleming  CODE STATUS: Limited code. DNI. She states that she would want CPR and ACLS medications if needed. NO VENTILATOR  CODE STATUS: Limited   TOTAL critical TIME TAKING CARE OF THIS PATIENT: 45 minutes.  >50% time spent on counselling and coordination of care  D/C DEPENDING ON CLINICAL CONDITION.   Chayce Rullo M.D on 12/18/2014 at 2:23 PM  Between 7am to 6pm - Pager - [phone]  After 6pm go to [email or web address] - password [email or web address] [facility]  [facility]  Office  [phone]  CC: Primary care physician; Hillery AldoPATEL, SARAH, MD

## 2014-12-18 NOTE — Plan of Care (Signed)
Problem: Discharge Progression Outcomes Goal: Discharge plan in place and appropriate Individualization pt from home. LIVES BY SELF/ HOME O2 AT 3 L [place]. GOOD FAMILY SUPPORT WITH DTR. Pt Was in last month. Hx of a fib Controlled with meds. Was being treated With po meds outpt for resp issues that did not work.  Outcome: Progressing Pt from home, lives by herself O2 at home at 3L Family support with daughter Pt here last night Hx of afib, controlled with meds    Goal: Other Discharge Outcomes/Goals Outcome: Progressing Hemodynamically stable - stable this shift Complications - pt positive for cdiff Diet - tolerating Activity - [facility]

## 2014-12-18 NOTE — Progress Notes (Signed)
PT Hold Note  Patient Details Name: Stefanie Bryan MRN: [id number] DOB: [birth year]   Cancelled Treatment:    Reason Eval/Treat Not Completed: Patient declined, no reason specified. Attempted to evaluate patient at 10:00 AM but refuses (see note). Since attempt pt has been transferred to CCU. Will need continuation order or new order in order to perform evaluation.  Sharalyn InkJason D Huprich PT, DPT   Huprich,Jason 12/18/2014, 11:48 AM

## 2014-12-18 NOTE — Progress Notes (Signed)
[facility] - [place] at [facility]   PATIENT NAME: Stefanie Bryan    MR#:  [id number]  DATE OF BIRTH:  1928-02-17  SUBJECTIVE:  Not feeling too well. Worsening breathing and respiratory symptoms  REVIEW OF SYSTEMS:   Review of Systems  Constitutional: Negative for fever, chills and weight loss.  HENT: Negative for ear discharge, ear pain and nosebleeds.   Eyes: Negative for blurred vision, pain and discharge.  Respiratory: Positive for cough, sputum production and shortness of breath. Negative for wheezing and stridor.   Cardiovascular: Negative for chest pain, palpitations, orthopnea and PND.  Gastrointestinal: Negative for nausea, vomiting, abdominal pain and diarrhea.  Genitourinary: Negative for urgency and frequency.  Musculoskeletal: Negative for back pain and joint pain.  Neurological: Positive for weakness. Negative for sensory change, speech change and focal weakness.  Psychiatric/Behavioral: Negative for depression. The patient is not nervous/anxious.   All other systems reviewed and are negative.  Tolerating Diet:yes Tolerating PT: eval pending  DRUG ALLERGIES:   Allergies  Allergen Reactions  . Adhesive [Tape] Rash  . Sulfa Antibiotics Rash    VITALS:  Blood pressure 134/64, pulse 67, temperature 97.8 F (36.6 C), temperature source Oral, resp. rate 22, height  (1.6 m), weight 71.26 kg (157 lb 1.6 oz), SpO2 97 %.  PHYSICAL EXAMINATION:   Physical Exam  GENERAL:  81 y.o.-year-old patient lying in the bed with acute distress. Appears crttically ill EYES: Pupils equal, round, reactive to light and accommodation. No scleral icterus. Extraocular muscles intact.  HEENT: Head atraumatic, normocephalic. Oropharynx and nasopharynx clear.  NECK:  Supple, no jugular venous distention. No thyroid enlargement, no tenderness.  LUNGS: Coarse breath sounds bilaterally, no wheezing,positive rales and rhonchi. No use of accessory muscles of  respiration.  CARDIOVASCULAR: S1, S2 normal. No murmurs, rubs, or gallops.  ABDOMEN: Soft, nontender, nondistended. Bowel sounds present. No organomegaly or mass.  EXTREMITIES: No cyanosis, clubbing or edema b/l.    NEUROLOGIC: Cranial nerves II through XII are intact. No focal Motor or sensory deficits b/l.  Subjective weakness + PSYCHIATRIC: The patient is alert and oriented x 3.  SKIN: No obvious rash, lesion, or ulcer.    LABORATORY PANEL:   CBC  Recent Labs Lab 12/16/14 0514  WBC 2.4*  HGB 7.8*  HCT 23.5*  PLT 264    Chemistries   Recent Labs Lab 12-16-14 1219 12/16/14 0514  12/18/14 0444  NA 141 144  --   --   K 4.0 4.3  --   --   CL 99* 99*  --   --   CO2 33* 34*  --   --   GLUCOSE 77 160*  --   --   BUN 23* 33*  --   --   CREATININE 1.49* 1.71*  < > 1.80*  CALCIUM 9.5 9.7  --   --   AST 29  --   --   --   ALT 16  --   --   --   ALKPHOS 92  --   --   --   BILITOT 0.6  --   --   --   < > = values in this interval not displayed.  Cardiac Enzymes  Recent Labs Lab 16-Dec-2014 1219  TROPONINI <0.03    RADIOLOGY:  Dg Chest Port 1 View  12/18/2014   CLINICAL DATA:  Hypoxia, history of pulmonary fibrosis with chronic respiratory failure, recent admission for community acquired pneumonia, now with worsening shortness of  breath, cough, and hemoptysis  EXAM: PORTABLE CHEST - 1 VIEW  COMPARISON:  2015-05-20  FINDINGS: Multifocal patchy opacities, upper lobe predominant, suspicious for multifocal pneumonia. This appearance has continued to progress from prior chest radiographs, particularly in the left upper lobe.  Underlying mild bibasilar scarring. No pleural effusion or pneumothorax.  The heart is normal in size.  IMPRESSION: Multifocal patchy opacities, upper lobe predominant, suspicious for multifocal pneumonia.  This appearance has continued to progress from prior chest radiographs, particularly in the left upper lobe.   Electronically Signed   By: Charline BillsSriyesh  Krishnan  M.D.   On: 12/18/2014 11:56   ASSESSMENT AND PLAN:  81 y.o. female with a known history of pulmonary fibrosis with chronic respiratory failure requiring 3 L of nasal cannula at all times, coronary artery disease, diastolic congestive heart failure, recent admission from June 12 to June 15 for community-acquired pneumonia who presents today with worsening shortness of breath, cough, sputum and hemoptysis   #1Suspected  healthcare associated pneumonia: [facility] for community-acquired pneumonia. She was doing well after her discharge until about 1 week ago. Chest x-ray now with a multifocal pneumonia. Blood negative so far and sputum cultures are pending.  -IV vancomycin and Zosyn. -cxr shows worsening infitrates >ARDS/pneumonia  #2 acute on chronic respiratory failure with hypoxia: She is on 3 L via nasal cannula chronically due to pulmonary fibrosis  -Dr. Reita ClicheFleming's input noted -She is currently requiring high fio2to keep oxygen saturations in the low 90s. She is in respiratory distress when she is in the bed. Easily desats -will transfer to CCU for BIPAP use, prn morphine -cont with scheduled nebulizers, antibiotics, and Solu-Medrol.  #3 congestive heart failure: She has a history of diastolic heart failure with an preserved ejection fraction by 2-D echocardiogram in June 2016. As trace edema and some of her chest x-ray findings may represent pulmonary edema.  -on lasix at home  #4 atrial fibrillation: Current EKG shows normal sinus rhythm. -Continue Coumadin per pharmacy consultation. Her INR is therapeutic. - Continue diltiazem, metoprolol. -d/ced amiodarone  #5 hypertension: Blood pressure well controlled at this time. Continue with amlodipine, diltiazem, metoprolol and Lasix.  #6 c diff diarrhea now on flagyl  #7 gout: No sign of gout flare at this time. Continue allopurinol  #8 chronic kidney disease stage III: Stable.  -creat rising  1.79. Hold lasix today  #9 anemia: chronic anemia of chronic disease. Has had a drop in hgb from 9.7 >> 7.6 since June 12.  - Continue PPI.  #10 DVT prophylaxis: She is on warfarin with a therapeutic INR  -overall appears decline in lung condition. Spoke with dter and aware for poor prognosis -Palliative care on Monday -?hospice  Management plans discussed with the patient  Spoke with Dr Meredeth IdeFleming  CODE STATUS: Limited code. DNI. She states that she would want CPR and ACLS medications if needed. NO VENTILATOR  CODE STATUS: Limited   TOTAL critical TIME TAKING CARE OF THIS PATIENT: 45 minutes.  >50% time spent on counselling and coordination of care  D/C DEPENDING ON CLINICAL CONDITION.   Lenford Beddow M.D on 12/18/2014 at 2:30 PM  Between 7am to 6pm - Pager - [phone]  After 6pm go to [email or web address] - password [email or web address]  [facility]  Office  [phone]  CC: Primary care physician; Hillery AldoPATEL, SARAH, MD

## 2014-12-18 NOTE — Progress Notes (Signed)
ANTICOAGULATION CONSULT NOTE - Follow up   Pharmacy Consult for Coumadin Indication: atrial fibrillation  Allergies  Allergen Reactions  . Adhesive [Tape] Rash  . Sulfa Antibiotics Rash    Patient Measurements: Height: 5\' 3"  (160 cm) Weight: 157 lb 1.6 oz (71.26 kg) IBW/kg (Calculated) : 52.4 Heparin Dosing Weight: n/a  Vital Signs: Temp: 97.8 F (36.6 C) (07/09 0459) Temp Source: Oral (07/09 0459) BP: 122/51 mmHg (07/09 0459) Pulse Rate: 67 (07/09 0459)  Labs:  Recent Labs  12/23/2014 1219 12/16/14 0514 12/17/14 0500 12/18/14 0444  HGB 7.6* 7.8*  --   --   HCT 23.9* 23.5*  --   --   PLT 250 264  --   --   APTT 58*  --   --   --   LABPROT 29.1* 29.7* 36.5* 35.5*  INR 2.74 2.81 3.68 3.55  CREATININE 1.49* 1.71* 1.85* 1.80*  TROPONINI <0.03  --   --   --     Estimated Creatinine Clearance: 22 mL/min (by C-G formula based on Cr of 1.8).   Medical History: Past Medical History  Diagnosis Date  . Hypertension   . Atrial fibrillation   . GERD (gastroesophageal reflux disease)   . Anginal pain   . Heart murmur   . Shortness of breath dyspnea   . Chronic kidney disease   . COPD (chronic obstructive pulmonary disease)     Pulmonary fibrosis possibly due to scleroderma, follows with Dr. Meredeth IdeFleming  . Coronary artery disease     Follows with Dr. Juliann Paresallwood  . CHF (congestive heart failure)     Diastolic heart failure, last echo June 2016 with ejection fraction 70%    Medications:  Prescriptions prior to admission  Medication Sig Dispense Refill Last Dose  . allopurinol (ZYLOPRIM) 100 MG tablet Take 100 mg by mouth daily.   12/28/2014 at Unknown time  . amiodarone (PACERONE) 200 MG tablet Take 200 mg by mouth at bedtime.    12/14/2014 at Unknown time  . amLODipine (NORVASC) 5 MG tablet Take 5 mg by mouth 2 (two) times daily.   12/30/2014 at Unknown time  . Calcium Carbonate-Vitamin D (CALCIUM 600+D) 600-400 MG-UNIT per tablet Take 1 tablet by mouth 2 (two) times daily.     01/09/2015 at Unknown time  . diltiazem (DILACOR XR) 120 MG 24 hr capsule Take 120 mg by mouth at bedtime.    12/14/2014 at Unknown time  . furosemide (LASIX) 20 MG tablet Take 20 mg by mouth daily.    01/05/2015 at Unknown time  . gemfibrozil (LOPID) 600 MG tablet Take 600 mg by mouth 2 (two) times daily before a meal.   12/19/2014 at Unknown time  . ipratropium-albuterol (DUONEB) 0.5-2.5 (3) MG/3ML SOLN Take 3 mLs by nebulization every 4 (four) hours as needed. (Patient taking differently: Take 3 mLs by nebulization every 4 (four) hours as needed (for shortness of breath). ) 360 mL 2 12/21/2014 at Unknown time  . metoprolol succinate (TOPROL-XL) 25 MG 24 hr tablet Take 25 mg by mouth at bedtime.    12/14/2014 at 2130  . pantoprazole (PROTONIX) 40 MG tablet Take 40 mg by mouth at bedtime.    12/14/2014 at Unknown time  . vitamin B-12 (CYANOCOBALAMIN) 1000 MCG tablet Take 1,000 mcg by mouth daily.   12/22/2014 at Unknown time  . warfarin (COUMADIN) 5 MG tablet Take 2.5-5 mg by mouth every evening. Pt takes one tablet on Tuesday and one-half tablet all other days.   12/14/2014 at  Unknown time  . warfarin (COUMADIN) 7.5 MG tablet Take 1 tablet (7.5 mg total) by mouth one time only at 6 PM. Take once a 11/24/2014, then resume your usual dosing (Patient not taking: Reported on 01/13/2015) 1 tablet 0     Assessment: Patient with history of a-fib to be continued on coumadin per pharmacy dosing. Home dosing of Coumadin is  on Tuesdays and 2.5mg  all other days.  INR = 3.55, Hgb: 7.8  Goal of Therapy:  INR 2-3 Monitor hemoglobin per policy   Plan:  Will hold warfarin for today due to INR =3.55. PT/INR with am labs.   Clarisa Schools, PharmD Clinical Pharmacist 12/18/2014

## 2014-12-19 LAB — BASIC METABOLIC PANEL
Anion gap: 10 (ref 5–15)
BUN: 35 mg/dL — ABNORMAL HIGH (ref 6–20)
CALCIUM: 8.7 mg/dL — AB (ref 8.9–10.3)
CO2: 32 mmol/L (ref 22–32)
Chloride: 101 mmol/L (ref 101–111)
Creatinine, Ser: 1.74 mg/dL — ABNORMAL HIGH (ref 0.44–1.00)
GFR, EST AFRICAN AMERICAN: 30 mL/min — AB (ref >60)
GFR, EST NON AFRICAN AMERICAN: 26 mL/min — AB (ref >60)
GLUCOSE: 179 mg/dL — AB (ref 65–99)
Potassium: 3.7 mmol/L (ref 3.5–5.1)
Sodium: 143 mmol/L (ref 135–145)

## 2014-12-19 LAB — CBC
HCT: 21.7 % — ABNORMAL LOW (ref 35.0–47.0)
Hemoglobin: 7.1 g/dL — ABNORMAL LOW (ref 12.0–16.0)
MCH: 29.9 pg (ref 26.0–34.0)
MCHC: 32.8 g/dL (ref 32.0–36.0)
MCV: 91.1 fL (ref 80.0–100.0)
PLATELETS: 284 10*3/uL (ref 150–440)
RBC: 2.38 MIL/uL — ABNORMAL LOW (ref 3.80–5.20)
RDW: 15.4 % — AB (ref 11.5–14.5)
WBC: 6.3 10*3/uL (ref 3.6–11.0)

## 2014-12-19 LAB — PROTIME-INR
INR: 3.59
Prothrombin Time: 35.8 seconds — ABNORMAL HIGH (ref 11.4–15.0)

## 2014-12-19 LAB — VANCOMYCIN, TROUGH: VANCOMYCIN TR: 13 ug/mL (ref 10–20)

## 2014-12-19 MED ORDER — MORPHINE SULFATE 2 MG/ML IJ SOLN
2.0000 mg | INTRAMUSCULAR | Status: DC | PRN
  Administered 2014-12-19: 2 mg via INTRAVENOUS
  Filled 2014-12-19 (×2): qty 1

## 2014-12-19 MED ORDER — BOOST / RESOURCE BREEZE PO LIQD
1.0000 | Freq: Three times a day (TID) | ORAL | Status: DC
  Administered 2014-12-19 – 2014-12-20 (×3): 1 via ORAL

## 2014-12-19 NOTE — Progress Notes (Signed)
ANTICOAGULATION CONSULT NOTE - Follow Up Consult  Pharmacy Consult for Coumadin Indication: atrial fibrillation  Allergies  Allergen Reactions  . Adhesive [Tape] Rash  . Sulfa Antibiotics Rash    Patient Measurements: Height: 5\' 3"  (160 cm) Weight: 166 lb 0.1 oz (75.3 kg) IBW/kg (Calculated) : 52.4   Vital Signs: Temp: 97.7 F (36.5 C) (07/09 1939) Temp Source: Axillary (07/09 1939) BP: 121/55 mmHg (07/10 0500) Pulse Rate: 82 (07/10 0500)  Labs:  Recent Labs  12/17/14 0500 12/18/14 0444 12/19/14 0457  HGB  --   --  7.1*  HCT  --   --  21.7*  PLT  --   --  284  LABPROT 36.5* 35.5* 35.8*  INR 3.68 3.55 3.59  CREATININE 1.85* 1.80*  --     Estimated Creatinine Clearance: 22.6 mL/min (by C-G formula based on Cr of 1.8).   Medications:  Scheduled:  . allopurinol  100 mg Oral Daily  . amLODipine  5 mg Oral Daily  . diltiazem  120 mg Oral QHS  . furosemide  20 mg Oral Daily  . gemfibrozil  600 mg Oral BID AC  . ipratropium-albuterol  3 mL Nebulization Q4H  . methylPREDNISolone (SOLU-MEDROL) injection  60 mg Intravenous Q12H  . metoprolol succinate  25 mg Oral QHS  . metronidazole  500 mg Intravenous Q8H  . pantoprazole  40 mg Oral QHS  . piperacillin-tazobactam (ZOSYN)  IV  3.375 g Intravenous 3 times per day  . vitamin B-12  1,000 mcg Oral Daily  . Warfarin - Pharmacist Dosing Inpatient   Does not apply q1800   Infusions:   PRN: acetaminophen **OR** acetaminophen, loperamide, magnesium hydroxide, morphine injection, ondansetron **OR** ondansetron (ZOFRAN) IV  Assessment: Patient with history of a-fib to be continued on coumadin per pharmacy dosing. Home dosing of Coumadin is 5mg  on Tuesdays and 2.5mg  all other days. INR remains supratherapeutic. Of note, patient is on metronidazole which may potentiate INR.   Goal of Therapy:  INR 2-3    Plan:  Will continue to hold warfarin and f/u AM INR.   Luisa Harthristy, Leilanee Righetti D 12/19/2014,6:13 AM

## 2014-12-19 NOTE — Progress Notes (Signed)
sats remain low.  Bipap 100%.  Daughter at bedside.  Patient remains alert and oriented.  Continue to monitor.

## 2014-12-19 NOTE — Progress Notes (Signed)
[facility] [place] [facility]   PATIENT NAME: Stefanie Bryan    MR#:  [id number]  DATE OF BIRTH:  04-11-1928  SUBJECTIVE:  Not feeling too well. Worsening breathing and respiratory symptoms  Now on 100% fio2 via BIPAP. desats very easily REVIEW OF SYSTEMS:   Review of Systems  Constitutional: Negative for fever, chills and weight loss.  HENT: Negative for ear discharge, ear pain and nosebleeds.   Eyes: Negative for blurred vision, pain and discharge.  Respiratory: Positive for cough, sputum production and shortness of breath. Negative for wheezing and stridor.   Cardiovascular: Negative for chest pain, palpitations, orthopnea and PND.  Gastrointestinal: Negative for nausea, vomiting, abdominal pain and diarrhea.  Genitourinary: Negative for urgency and frequency.  Musculoskeletal: Negative for back pain and joint pain.  Neurological: Positive for weakness. Negative for sensory change, speech change and focal weakness.  Psychiatric/Behavioral: Negative for depression. The patient is not nervous/anxious.   All other systems reviewed and are negative.  Tolerating Diet:yes Tolerating PT: eval on hold due to respiratory status  DRUG ALLERGIES:   Allergies  Allergen Reactions  . Adhesive [Tape] Rash  . Sulfa Antibiotics Rash    VITALS:  Blood pressure 113/53, pulse 76, temperature 97.7 F (36.5 C), temperature source Axillary, resp. rate 21, height 5\' 3"  (1.6 m), weight 75.3 kg (166 lb 0.1 oz), SpO2 90 %.  PHYSICAL EXAMINATION:   Physical Exam  GENERAL:  81 y.o.-year-old patient lying in the bed with acute distress. Appears crttically ill EYES: Pupils equal, round, reactive to light and accommodation. No scleral icterus. Extraocular muscles intact.  HEENT: Head atraumatic, normocephalic. Oropharynx and nasopharynx clear.  NECK:  Supple, no jugular venous distention. No thyroid enlargement, no tenderness.  LUNGS: Coarse breath sounds  bilaterally, no wheezing,positive rales and rhonchi. Intermittent use of accessory muscles of respiration. BIPAP+ CARDIOVASCULAR: S1, S2 normal. No murmurs, rubs, or gallops.  ABDOMEN: Soft, nontender, nondistended. Bowel sounds present. No organomegaly or mass.  EXTREMITIES: No cyanosis, clubbing or edema b/l.    NEUROLOGIC: Cranial nerves II through XII are intact. No focal Motor or sensory deficits b/l.  Subjective weakness + PSYCHIATRIC  patient is alert and oriented x 3.  SKIN: No obvious rash, lesion, or ulcer.   LABORATORY PANEL:   CBC  Recent Labs Lab 12/19/14 0457  WBC 6.3  HGB 7.1*  HCT 21.7*  PLT 284    Chemistries   Recent Labs Lab 01/08/2015 1219  12/19/14 0457  NA 141  < > 143  K 4.0  < > 3.7  CL 99*  < > 101  CO2 33*  < > 32  GLUCOSE 77  < > 179*  BUN 23*  < > 35*  CREATININE 1.49*  < > 1.74*  CALCIUM 9.5  < > 8.7*  AST 29  --   --   ALT 16  --   --   ALKPHOS 92  --   --   BILITOT 0.6  --   --   < > = values in this interval not displayed.  Cardiac Enzymes  Recent Labs Lab 01/07/2015 1219  TROPONINI <0.03    RADIOLOGY:  Dg Chest Port 1 View  12/18/2014   CLINICAL DATA:  Hypoxia, history of pulmonary fibrosis with chronic respiratory failure, recent admission for community acquired pneumonia, now with worsening shortness of breath, cough, and hemoptysis  EXAM: PORTABLE CHEST - 1 VIEW  COMPARISON:  01/05/2015  FINDINGS: Multifocal patchy opacities, upper lobe predominant,  suspicious for multifocal pneumonia. This appearance has continued to progress from prior chest radiographs, particularly in the left upper lobe.  Underlying mild bibasilar scarring. No pleural effusion or pneumothorax.  The heart is normal in size.  IMPRESSION: Multifocal patchy opacities, upper lobe predominant, suspicious for multifocal pneumonia.  This appearance has continued to progress from prior chest radiographs, particularly in the left upper lobe.   Electronically Signed   By:  Charline Bills M.D.   On: 12/18/2014 11:56   ASSESSMENT AND PLAN:  81 y.o. female with a known history of pulmonary fibrosis with chronic respiratory failure requiring 3 L of nasal cannula at all times, coronary artery disease, diastolic congestive heart failure, recent admission from June 12 to June 15 for community-acquired pneumonia who presents today with worsening shortness of breath, cough, sputum and hemoptysis   #1Suspected  healthcare associated pneumonia: She was discharged June 15 from [facility] after treatment for community-acquired pneumonia. She was doing well after her discharge until about 1 week ago. Chest x-ray now with a multifocal pneumonia. Blood negative so far and sputum cultures are pending.  -IV vancomycin and Zosyn. -cxr shows worsening infitrates >ARDS/pneumonia  #2 acute on chronic respiratory failure with hypoxia: She is on 3 L via nasal cannula chronically due to pulmonary fibrosis  -Dr. Reita Cliche input noted -She is currently requiring high fio2to keep oxygen saturations in the low 90s. She is in respiratory distress when she is in the bed. Easily desats -on 100% Fio2 thru BIPAP use, prn morphine -cont with scheduled nebulizers, antibiotics, and Solu-Medrol.  #3 congestive heart failure: She has a history of diastolic heart failure with an preserved ejection fraction by 2-D echocardiogram in June 2016. As trace edema and some of her chest x-ray findings may represent pulmonary edema.  -on lasix at home  #4 atrial fibrillation: Current EKG shows normal sinus rhythm. -Continue Coumadin per pharmacy consultation. Her INR is therapeutic. - Continue diltiazem, metoprolol. -d/ced amiodarone  #5 hypertension: Blood pressure well controlled at this time. Continue with amlodipine, diltiazem, metoprolol and Lasix.  #6 c diff diarrhea now on flagyl  #7 gout: No sign of gout flare at this time. Continue allopurinol  #8 chronic kidney disease stage III: Stable.  -creat  rising 1.79. Hold lasix today  #9 anemia: chronic anemia of chronic disease. Has had a drop in hgb from 9.7 >> 7.6 since June 12.  - Continue PPI.  #10 DVT prophylaxis: She is on warfarin with a therapeutic INR  -overall appears decline in lung condition. Spoke with dter and aware for poor prognosis -Palliative care on Monday -?hospice  Spoke with dter and will discuss options with her when she comes to CCU  Management plans discussed with the patient  Spoke with DrKhan  CODE STATUS: Limited code. DNI. She states that she would want CPR and ACLS medications if needed. NO VENTILATOR  CODE STATUS: Limited   TOTAL critical TIME TAKING CARE OF THIS PATIENT: 45 minutes.  >50% time spent on counselling and coordination of care  D/C DEPENDING ON CLINICAL CONDITION.   Somnang Mahan M.D on 12/19/2014 at 8:40 AM  Between 7am to 6pm - Pager - [phone]  After 6pm go to [email or web address] - password [email or web address] [email or web address]  [facility]  Office  [phone]  CC: Primary care physician; Hillery Aldo, MD

## 2014-12-19 NOTE — Progress Notes (Signed)
Name: Stefanie Bryan MRN: [id number] DOB: [birth year]    LOS: 4  Referring Provider:  Enedina Finner, MD Reason for Referral:  Acute Respiratory failure  PULMONARY / CRITICAL CARE MEDICINE  HPI:  81 yo female with history of Pulmonary Fibrosis Chronic respiratory failure on Home oxygen presented to the ED with cough and congestion. She had been haivng increased sputum production and hemoptysis. Patient ws admitted to the floor initially but had a decline in her status which required her to be transferred to the ICU and now she is on BIPAP. She is a DNR and does not want to be intubated  Past Medical History  Diagnosis Date  . Hypertension   . Atrial fibrillation   . GERD (gastroesophageal reflux disease)   . Anginal pain   . Heart murmur   . Shortness of breath dyspnea   . Chronic kidney disease   . COPD (chronic obstructive pulmonary disease)     Pulmonary fibrosis possibly due to scleroderma, follows with Dr. Meredeth Ide  . Coronary artery disease     Follows with Dr. Juliann Pares  . CHF (congestive heart failure)     Diastolic heart failure, last echo June 2016 with ejection fraction 70%   Past Surgical History  Procedure Laterality Date  . Abdominal hysterectomy    . Tubal ligation    . Cholecystectomy    . Knee surgery      right and left  . Coronary angioplasty     Prior to Admission medications   Medication Sig Start Date End Date Taking? Authorizing Provider  allopurinol (ZYLOPRIM) 100 MG tablet Take 100 mg by mouth daily.   Yes Historical Provider, MD  amiodarone (PACERONE) 200 MG tablet Take 200 mg by mouth at bedtime.    Yes Historical Provider, MD  amLODipine (NORVASC) 5 MG tablet Take 5 mg by mouth 2 (two) times daily.   Yes Historical Provider, MD  Calcium Carbonate-Vitamin D (CALCIUM 600+D) 600-400 MG-UNIT per tablet Take 1 tablet by mouth 2 (two) times daily.    Yes Historical Provider, MD  diltiazem (DILACOR XR) 120 MG 24 hr capsule Take 120 mg by mouth at bedtime.     Yes Historical Provider, MD  furosemide (LASIX) 20 MG tablet Take 20 mg by mouth daily.    Yes Historical Provider, MD  gemfibrozil (LOPID) 600 MG tablet Take 600 mg by mouth 2 (two) times daily before a meal.   Yes Historical Provider, MD  ipratropium-albuterol (DUONEB) 0.5-2.5 (3) MG/3ML SOLN Take 3 mLs by nebulization every 4 (four) hours as needed. Patient taking differently: Take 3 mLs by nebulization every 4 (four) hours as needed (for shortness of breath).  11/24/14  Yes Katharina Caper, MD  metoprolol succinate (TOPROL-XL) 25 MG 24 hr tablet Take 25 mg by mouth at bedtime.    Yes Historical Provider, MD  pantoprazole (PROTONIX) 40 MG tablet Take 40 mg by mouth at bedtime.    Yes Historical Provider, MD  vitamin B-12 (CYANOCOBALAMIN) 1000 MCG tablet Take 1,000 mcg by mouth daily.   Yes Historical Provider, MD  warfarin (COUMADIN) 5 MG tablet Take 2.5-5 mg by mouth every evening. Pt takes one tablet on Tuesday and one-half tablet all other days.   Yes Historical Provider, MD  warfarin (COUMADIN) 7.5 MG tablet Take 1 tablet (7.5 mg total) by mouth one time only at 6 PM. Take once a 11/24/2014, then resume your usual dosing Patient not taking: Reported on 12/27/2014 11/24/14   Katharina Caper, MD   Allergies  Allergies  Allergen Reactions  . Adhesive [Tape] Rash  . Sulfa Antibiotics Rash    Family History Family History  Problem Relation Age of Onset  . Diverticulitis Brother    Social History  reports that she has never smoked. She has never used smokeless tobacco. She reports that she does not drink alcohol or use illicit drugs.  Review Of Systems:   Constitutional: Negative for fever, chills, weight loss and malaise/fatigue.  HENT: Positive for congestion. Negative for hearing loss and sore throat.  Eyes: Negative for blurred vision and pain.  Respiratory: Positive for cough, hemoptysis, sputum production, shortness of breath and wheezing.  Cardiovascular: Positive for leg  swelling. Negative for chest pain Gastrointestinal: Negative for nausea, vomiting, abdominal pain, diarrhea.  Genitourinary: Negative for dysuria and frequency.  Musculoskeletal: Negative for myalgias, back pain Skin: Negative for rash.  Neurological: Negative for dizziness, focal weakness, loss of consciousness and headaches   Vital Signs: Temp:  [97.6 F (36.4 C)-97.7 F (36.5 C)] 97.7 F (36.5 C) (07/09 1939) Pulse Rate:  [55-109] 109 (07/10 0935) Resp:  [15-25] 21 (07/10 0935) BP: (90-151)/(43-78) 119/52 mmHg (07/10 0928) SpO2:  [76 %-100 %] 100 % (07/10 0935) FiO2 (%):  [80 %-100 %] 100 % (07/10 0935) Weight:  [75.3 kg (166 lb 0.1 oz)] 75.3 kg (166 lb 0.1 oz) (07/10 0500)  Physical Examination: GENERAL: On BIPAP EYES: Pupils equal, round, reactive to light and accommodation HEENT: Head atraumatic, normocephalic. NECK: Supple, no jugular venous distention. No thyroid enlargement, no tenderness.  LUNGS: Scattered crackles, fair air movement, mild respiratory distress CARDIOVASCULAR: S1, S2 normal. No murmurs, rubs, or gallops. 5/6 systolic ejection murmur, irregular ABDOMEN: Soft, nontender, nondistended. Bowel sounds present. No organomegaly or mass.  EXTREMITIES: Trace pedal edema bilaterally, no cyanosis, or clubbing.  NEUROLOGIC: Cranial nerves II through XII are intact.   Principal Problem:   HCAP (healthcare-associated pneumonia) Active Problems:   CHF (congestive heart failure)   HTN (hypertension)   A-fib   Pulmonary fibrosis   Acute on chronic respiratory failure with hypoxia   ASSESSMENT AND PLAN  PULMONARY No results for input(s): PHART, PCO2ART, PO2ART, HCO3, O2SAT in the last 168 hours.  Invalid input(s): PCO2 Ventilator Settings: Vent Mode:  [-]  FiO2 (%):  [80 %-100 %] 100 %   A:  Acute respiratory Failure with Hypoxia P:   -will continue with BIPAP now is on 100% FiO2 -will titrate pressures as needed -will continue with DNR status at  this time  CARDIOVASCULAR  Recent Labs Lab 02-23-15 1219 02-23-15 1237 02-23-15 1513  TROPONINI <0.03  --   --   LATICACIDVEN  --  0.8 1.2    A: diastolic heart failure P:  Currently on BIPAP will continue Continue with home medications  RENAL  Recent Labs Lab 02-23-15 1219 12/16/14 0514 12/17/14 0500 12/18/14 0444 12/19/14 0457  NA 141 144  --   --  143  K 4.0 4.3  --   --  3.7  CL 99* 99*  --   --  101  CO2 33* 34*  --   --  32  BUN 23* 33*  --   --  35*  CREATININE 1.49* 1.71* 1.85* 1.80* 1.74*  CALCIUM 9.5 9.7  --   --  8.7*   Intake/Output      07/09 0701 - 07/10 0700 07/10 0701 - 07/11 0700   P.O.     IV Piggyback 412.5    Total Intake(mL/kg) 412.5 (5.5)  Urine (mL/kg/hr) 1950 (1.1)    Stool 0 (0)    Total Output 1950     Net -1537.5          Urine Occurrence 1 x    Stool Occurrence 1 x       A:  CKD III P:   Will continue to monitor IOs -will follow up labs  GASTROINTESTINAL  Recent Labs Lab 12/27/2014 1219  AST 29  ALT 16  ALKPHOS 92  BILITOT 0.6  PROT 6.9  ALBUMIN 3.6    A:  Currently Stable P:   GI prophylaxis  HEMATOLOGIC  Recent Labs Lab 12/11/2014 1219 12/16/14 0514 12/17/14 0500 12/18/14 0444 12/19/14 0457  HGB 7.6* 7.8*  --   --  7.1*  HCT 23.9* 23.5*  --   --  21.7*  PLT 250 264  --   --  284  INR 2.74 2.81 3.68 3.55 3.59  APTT 58*  --   --   --   --    A:  Anemia P:  No active bleeding -monitor h/h transfuse as needed  INFECTIOUS  Recent Labs Lab 01/06/2015 1219 12/16/14 0514 12/19/14 0457  WBC 2.6* 2.4* 6.3   Antibiotics:   A:  HCAP/Sepsis P:   Will continue with antibiotics Follow up cultures    I have personally obtained a history, examined the patient, evaluated laboratory and imaging results, formulated the assessment and plan and placed orders.  The Patient requires high complexity decision making for assessment and support, frequent evaluation and titration of therapies, application of  advanced monitoring technologies and extensive interpretation of multiple databases.    Critical Care Time:  Yevonne Pax, MD [facility] Pulmonary Critical Care Medicine Sleep Medicine

## 2014-12-19 NOTE — Progress Notes (Signed)
ANTIBIOTIC CONSULT NOTE - FOLLOW UP  Pharmacy Consult for Zosyn Indication: pneumonia (HCAP)  Allergies  Allergen Reactions  . Adhesive [Tape] Rash  . Sulfa Antibiotics Rash    Patient Measurements: Height:  (160 cm) Weight: 166 lb 0.1 oz (75.3 kg) IBW/kg (Calculated) : 52.4 Adjusted Body Weight: 61.4 kg  Vital Signs: BP: 119/52 mmHg (07/10 0928) Pulse Rate: 109 (07/10 0935) Intake/Output from previous day: 07/09 0701 - 07/10 0700 In: 412.5 [IV Piggyback:412.5] Out: 1950 [Urine:1950] Intake/Output from this shift:    Labs:  Recent Labs  12/17/14 0500 12/18/14 0444 12/19/14 0457  WBC  --   --  6.3  HGB  --   --  7.1*  PLT  --   --  284  CREATININE 1.85* 1.80* 1.74*   Estimated Creatinine Clearance: 23.4 mL/min (by C-G formula based on Cr of 1.74).  Recent Labs  12/19/14 0722  [facility] 13     Microbiology: Recent Results (from the past 720 hour(s))  Culture, blood (routine x 2)     Status: None   Collection Time: 11/21/14  4:02 AM  Result Value Ref Range Status   Specimen Description BLOOD  Final   Special Requests Normal  Final   Culture NO GROWTH 5 DAYS  Final   Report Status 11/26/2014 FINAL  Final  Culture, blood (routine x 2)     Status: None   Collection Time: 11/21/14  4:02 AM  Result Value Ref Range Status   Specimen Description BLOOD  Final   Special Requests Normal  Final   Culture NO GROWTH 5 DAYS  Final   Report Status 11/26/2014 FINAL  Final  Culture, sputum-assessment     Status: None   Collection Time: 11/21/14 10:26 AM  Result Value Ref Range Status   Specimen Description SPUTUM  Final   Special Requests NONE  Final   Sputum evaluation   Final    Sputum specimen not acceptable for testing.  Please recollect.   CALLED TO AMANDA,RN EXT [phone]     Report Status 11/21/2014 FINAL  Final  Blood Culture (routine x 2)     Status: None (Preliminary result)   Collection Time: 12/23/2014 12:10 PM  Result Value Ref Range Status    Specimen Description BLOOD LFA  Final   Special Requests BOTTLES DRAWN AEROBIC AND ANAEROBIC 2.5  Final   Culture NO GROWTH 4 DAYS  Final   Report Status PENDING  Incomplete  Blood Culture (routine x 2)     Status: None (Preliminary result)   Collection Time: 12/26/2014 12:19 PM  Result Value Ref Range Status   Specimen Description BLOOD RIGHT ANTECUBITAL  Final   Special Requests BOTTLES DRAWN AEROBIC AND ANAEROBIC 1/3  Final   Culture NO GROWTH 4 DAYS  Final   Report Status PENDING  Incomplete  C difficile quick scan w PCR reflex ([facility] only)     Status: None   Collection Time: 12/17/14  5:38 PM  Result Value Ref Range Status   C Diff antigen POSITIVE  Final   C Diff toxin NEGATIVE  Final   C Diff interpretation REFLEX TO PCR  Final  Clostridium Difficile by PCR (not at [facility])     Status: Abnormal   Collection Time: 12/17/14  5:38 PM  Result Value Ref Range Status   C difficile by pcr POSITIVE (A) NEGATIVE Final    Comment: CRITICAL RESULT CALLED TO, READ BACK BY AND VERIFIED WITH: CYNTHIA BURGESS AT 1947 12/17/14.PMH  Medical History: Past Medical History  Diagnosis Date  . Hypertension   . Atrial fibrillation   . GERD (gastroesophageal reflux disease)   . Anginal pain   . Heart murmur   . Shortness of breath dyspnea   . Chronic kidney disease   . COPD (chronic obstructive pulmonary disease)     Pulmonary fibrosis possibly due to scleroderma, follows with Dr. Meredeth IdeFleming  . Coronary artery disease     Follows with Dr. Juliann Pa[place]  . CHF (congestive heart failure)     Diastolic heart failure, last echo June 2016 with ejection fraction 70%     Assessment: 81 yo female admitted with HCAP admitted to ICU and started on empiric therapy with Vancomycin and Zosyn.  Vancomycin discontinued by MD on 7/11.  SCr: 1.74, est CrCl~23 mL/min  Goal of Therapy:  Resolution of infection  Plan:  Patient currently ordered Zosyn 3.375 gm IV q8h per EI protocol.  Continue current orders.    Clarisa Schoolsrystal Raysean Graumann, PharmD Clinical Pharmacist 12/19/2014

## 2014-12-19 NOTE — Progress Notes (Signed)
Morphine given for increased WOB. Hypoxia

## 2014-12-19 NOTE — Progress Notes (Signed)
Met with pt's dter Sherron Ales and pt in CCU Updated dter,discussed cxr findings, overall poor prognosis. Pt and dter understand pt may not pull thru tthis admission.  Increased morphine to 2 mg IV q4 hourly for symptom relief Re-addressed code status. Pt will let us know bout her idea of CPR and Cardioversion or not. She does not want to be intubated Chaplain service offered Spoke about transitioning to Hsopice home if needed and pt is agreeable dter appreciates the meeting Critical time 30 mins

## 2014-12-20 LAB — CULTURE, BLOOD (ROUTINE X 2)
Culture: NO GROWTH
Culture: NO GROWTH

## 2014-12-20 LAB — PROTIME-INR
INR: 3.88
Prothrombin Time: 38 seconds — ABNORMAL HIGH (ref 11.4–15.0)

## 2014-12-20 LAB — GLUCOSE, CAPILLARY: Glucose-Capillary: 155 mg/dL — ABNORMAL HIGH (ref 65–99)

## 2014-12-20 MED ORDER — STERILE WATER FOR INJECTION IJ SOLN
INTRAMUSCULAR | Status: AC
  Filled 2014-12-20: qty 10

## 2014-12-20 MED ORDER — MORPHINE SULFATE 2 MG/ML IJ SOLN
2.0000 mg | INTRAMUSCULAR | Status: DC | PRN
  Administered 2014-12-20 (×4): 2 mg via INTRAVENOUS
  Filled 2014-12-20 (×3): qty 1

## 2014-12-20 MED ORDER — LORAZEPAM 2 MG/ML IJ SOLN
2.0000 mg | INTRAMUSCULAR | Status: DC | PRN
  Administered 2014-12-20: 2 mg via INTRAVENOUS

## 2014-12-20 MED ORDER — MORPHINE SULFATE 2 MG/ML IJ SOLN
INTRAMUSCULAR | Status: AC
  Filled 2014-12-20: qty 1

## 2014-12-20 MED ORDER — LORAZEPAM 2 MG/ML IJ SOLN
INTRAMUSCULAR | Status: AC
  Filled 2014-12-20: qty 1

## 2014-12-20 MED ORDER — FUROSEMIDE 10 MG/ML IJ SOLN
20.0000 mg | Freq: Every day | INTRAMUSCULAR | Status: DC
  Administered 2014-12-20: 20 mg via INTRAVENOUS
  Filled 2014-12-20: qty 2

## 2014-12-20 MED ORDER — MORPHINE SULFATE (CONCENTRATE) 10 MG /0.5 ML PO SOLN: 10.0000 mg | ORAL | Status: AC | PRN

## 2014-12-20 MED ORDER — LORAZEPAM 2 MG/ML PO CONC: 2.0000 mg | ORAL | Status: AC | PRN

## 2014-12-20 MED ORDER — MORPHINE SULFATE 2 MG/ML IJ SOLN
2.0000 mg | INTRAMUSCULAR | Status: DC | PRN
  Administered 2014-12-20: 2 mg via INTRAVENOUS
  Administered 2014-12-20: 4 mg via INTRAVENOUS
  Filled 2014-12-20: qty 2

## 2014-12-22 LAB — GLUCOSE, CAPILLARY: GLUCOSE-CAPILLARY: 245 mg/dL — AB (ref 65–99)

## 2015-01-10 NOTE — Discharge Instructions (Signed)
Hospice °Hospice is a service that is designed to provide people who are terminally ill and their families with medical, spiritual, and psychological support. Its aim is to improve your quality of life by keeping you as alert and comfortable as possible. Hospice is performed by a team of health care professionals and volunteers who: °· Help keep you comfortable. Hospice can be provided in your home or in a homelike setting. The hospice staff works with your family and friends to help meet your needs. You will enjoy the support of loved ones by receiving much of your basic care from family and friends. °· Provide pain relief and manage your symptoms. The staff supply all necessary medicines and equipment. °· Provide companionship when you are alone. °· Allow you and your family to rest. They may do light housekeeping, prepare meals, and run errands. °· Provide counseling. They will make sure your emotional, spiritual, and social needs and those of your family are being met. °· Provide spiritual care. Spiritual care is individualized to meet your needs and your family's needs. It may involve helping you look at what death means to you, say goodbye, or perform a specific religious ceremony or ritual. °Hospice teams often include: °· A nurse. °· A doctor. °· Social workers. °· Religious leaders (such as a chaplain). °· Trained volunteers. °WHEN SHOULD HOSPICE CARE BEGIN? °Most people who use hospice are believed to have fewer than 6 months to live. Your family and health care providers can help you decide when hospice services should begin. If your condition improves, you may discontinue the program. °WHAT SHOULD I CONSIDER BEFORE SELECTING A PROGRAM? °Most hospice programs are run by nonprofit, independent organizations. Some are affiliated with hospitals, nursing homes, or home health care agencies. Hospice programs can take place in the home or at a hospice center, hospital, or skilled nursing facility. When choosing  a hospice program, ask the following questions: °· What services are available to me? °· What services are offered to my loved ones? °· How involved are my loved ones? °· How involved is my health care provider? °· Who makes up the hospice care team? How are they trained or screened? °· How will my pain and symptoms be managed? °· If my circumstances change, can the services be provided in a different setting, such as my home or in the hospital? °· Is the program reviewed and licensed by the state or certified in some other way? °WHERE CAN I LEARN MORE ABOUT HOSPICE? °You can learn about existing hospice programs in your area from your health care providers. You can also read more about hospice online. The websites of the following organizations contain helpful information: °· The National Hospice and Palliative Care Organization (NHPCO). °· The Hospice Association of America (HAA). °· The Hospice Education Institute. °· The American Cancer Society (ACS). °· Hospice Net. °Document Released: 09/14/2003 Document Revised: 06/02/2013 Document Reviewed: 04/07/2013 °ExitCare® Patient Information ©2015 [employer]. This information is not intended to replace advice given to you by your health care provider. Make sure you discuss any questions you have with your health care provider. ° °

## 2015-01-10 NOTE — Progress Notes (Signed)
   22-Dec-2014 1332  Clinical Encounter Type  Visited With Patient and family together  Visit Type Patient actively dying  Referral From Nurse  Consult/Referral To Chaplain  Spiritual Encounters  Spiritual Needs Emotional;Grief support  Stress Factors  Patient Stress Factors Health changes  Family Stress Factors Health changes;Loss  Chaplain offered a compassionate presence and support as patient was actively dying. Patient died and support family as patient transitioned. Chaplain Gracee Ratterree A. Ariyannah Pauling Ext. [phone]

## 2015-01-10 NOTE — Discharge Summary (Signed)
[facility] -  at [facility]   PATIENT NAME: Stefanie Bryan    MR#:  [id number]  DATE OF BIRTH:  09-Sep-1927  DATE OF ADMISSION:  12/25/2014 ADMITTING PHYSICIAN: Gale Journeyatherine P Walsh, MD  DATE OF DISCHARGE: No discharge date for patient encounter.  PRIMARY CARE PHYSICIAN: Hillery AldoPATEL, SARAH, MD   ADMISSION DIAGNOSIS:  Healthcare-associated pneumonia [J18.9] Acute respiratory failure with hypoxia [J96.01] DISCHARGE DIAGNOSIS:  Principal Problem:   HCAP (healthcare-associated pneumonia) Active Problems:   CHF (congestive heart failure)   HTN (hypertension)   A-fib   Pulmonary fibrosis   Acute on chronic respiratory failure with hypoxia  SECONDARY DIAGNOSIS:   Past Medical History  Diagnosis Date  . Hypertension   . Atrial fibrillation   . GERD (gastroesophageal reflux disease)   . Anginal pain   . Heart murmur   . Shortness of breath dyspnea   . Chronic kidney disease   . COPD (chronic obstructive pulmonary disease)     Pulmonary fibrosis possibly due to scleroderma, follows with Dr. Meredeth IdeFleming  . Coronary artery disease     Follows with Dr. Juliann Paresallwood  . CHF (congestive heart failure)     Diastolic heart failure, last echo June 2016 with ejection fraction 70%   HOSPITAL COURSE:  81 y.o. female with a known history of pulmonary fibrosis with chronic respiratory failure requiring 3 L of nasal cannula at all times, coronary artery disease, diastolic congestive heart failure, recent admission from June 12 to June 15 for community-acquired pneumonia readmitted for with worsening shortness of breath, cough, sputum and hemoptysis with Health-care associated pneumonia.  Please see Dr. Claris CheWalsh's dated history and physical for further details.  She was treated with broad-spectrum IV antibiotics.  He continued to have difficulty breathing with acute on chronic respiratory failure persistently requiring BiPAP.  A long discussion with patient and family, decision was  made to make her DO NOT RESUSCITATE.  Also, comfort care initiated.  She was agreeable with hospice home and she is being discharged there in fair condition.  ** Suspected healthcare associated pneumonia: She was [facility] after treatment for community-acquired pneumonia. She was doing well after her discharge until about 1 week ago. Chest x-ray now with a multifocal pneumonia. cxr shows worsening infitrates >ARDS/pneumonia. Comfort care only now.  ** Acute on chronic respiratory failure with hypoxia: Likely due to underlying severe pulmonary fibrosis.  Unable to wean her off BiPAP. DISCHARGE CONDITIONS:  Fair CONSULTS OBTAINED:  Treatment Team:  Mertie MooresHerbon E Fleming, MD DRUG ALLERGIES:   Allergies  Allergen Reactions  . Adhesive [Tape] Rash  . Sulfa Antibiotics Rash   DISCHARGE MEDICATIONS:   Current Discharge Medication List    START taking these medications   Details  LORazepam (ATIVAN) 2 MG/ML concentrated solution Take 1 mL (2 mg total) by mouth every 4 (four) hours as needed for anxiety or sleep. Qty: 30 mL, Refills: 0    Morphine Sulfate (MORPHINE CONCENTRATE) 10 mg / 0.5 ml concentrated solution Take 0.5 mLs (10 mg total) by mouth every 3 (three) hours as needed for severe pain or shortness of breath. Qty: 5 mL, Refills: 0      STOP taking these medications     allopurinol (ZYLOPRIM) 100 MG tablet      amiodarone (PACERONE) 200 MG tablet      amLODipine (NORVASC) 5 MG tablet      Calcium Carbonate-Vitamin D (CALCIUM 600+D) 600-400 MG-UNIT per tablet      diltiazem (DILACOR XR)  120 MG 24 hr capsule      furosemide (LASIX) 20 MG tablet      gemfibrozil (LOPID) 600 MG tablet      ipratropium-albuterol (DUONEB) 0.5-2.5 (3) MG/3ML SOLN      metoprolol succinate (TOPROL-XL) 25 MG 24 hr tablet      pantoprazole (PROTONIX) 40 MG tablet      vitamin B-12 (CYANOCOBALAMIN) 1000 MCG tablet      warfarin (COUMADIN) 5 MG tablet      warfarin  (COUMADIN) 7.5 MG tablet        DISCHARGE INSTRUCTIONS:   DIET:  Regular diet  DISCHARGE CONDITION:  Serious  ACTIVITY:  Bedrest  OXYGEN:  Home Oxygen: Yes.     Oxygen Delivery: 2-3 liters/min via Patient connected to nasal cannula oxygen  DISCHARGE LOCATION:  Hospice Home  If you experience worsening of your admission symptoms, develop shortness of breath, life threatening emergency, suicidal or homicidal thoughts you must seek medical attention immediately by calling 911 or calling your MD immediately  if symptoms less severe.  You Must read complete instructions/literature along with all the possible adverse reactions/side effects for all the Medicines you take and that have been prescribed to you. Take any new Medicines after you have completely understood and accpet all the possible adverse reactions/side effects.   Please note  You were cared for by a hospitalist during your hospital stay. If you have any questions about your discharge medications or the care you received while you were in the hospital after you are discharged, you can call the unit and asked to speak with the hospitalist on call if the hospitalist that took care of you is not available. Once you are discharged, your primary care physician will handle any further medical issues. Please note that NO REFILLS for any discharge medications will be authorized once you are discharged, as it is imperative that you return to your primary care physician (or establish a relationship with a primary care physician if you do not have one) for your aftercare needs so that they can reassess your need for medications and monitor your lab values.  On the day of Discharge:  VITAL SIGNS:  Blood pressure 139/57, pulse 88, temperature 97.6 F (36.4 C), temperature source Axillary, resp. rate 26, height  (1.6 m), weight 69.7 kg (153 lb 10.6 oz), SpO2 75 %. I/O:   Intake/Output Summary (Last 24 hours) at 12/25/2014 1119 Last  data filed at 12/27/2014 0940  Gross per 24 hour  Intake    535 ml  Output    710 ml  Net   -175 ml   PHYSICAL EXAMINATION:  GENERAL: 81 y.o.-year-old patient lying in the bed with acute distress. Appears crttically ill EYES: Pupils equal, round, reactive to light and accommodation. No scleral icterus. Extraocular muscles intact.  HEENT: Head atraumatic, normocephalic. Oropharynx and nasopharynx clear.  NECK: Supple, no jugular venous distention. No thyroid enlargement, no tenderness.  LUNGS: Coarse breath sounds bilaterally, no wheezing,positive rales and rhonchi. Intermittent use of accessory muscles of respiration. BIPAP+ CARDIOVASCULAR: S1, S2 normal. No murmurs, rubs, or gallops.  ABDOMEN: Soft, nontender, nondistended. Bowel sounds present. No organomegaly or mass.  EXTREMITIES: No cyanosis, clubbing or edema b/l.  NEUROLOGIC: Cranial nerves II through XII are intact. No focal Motor or sensory deficits b/l. Subjective weakness + PSYCHIATRIC patient is alert and oriented x 3.  SKIN: No obvious rash, lesion, or ulcer.  DATA REVIEW:   CBC Recent Labs Lab 12/19/14 0457  WBC  6.3  HGB 7.1*  HCT 21.7*  PLT 284   Chemistries  Recent Labs Lab 12-31-2014 1219  12/19/14 0457  NA 141  < > 143  K 4.0  < > 3.7  CL 99*  < > 101  CO2 33*  < > 32  GLUCOSE 77  < > 179*  BUN 23*  < > 35*  CREATININE 1.49*  < > 1.74*  CALCIUM 9.5  < > 8.7*  AST 29  --   --   ALT 16  --   --   ALKPHOS 92  --   --   BILITOT 0.6  --   --   < > = values in this interval not displayed.  Microbiology Results  Results for orders placed or performed during the hospital encounter of December 31, 2014  Blood Culture (routine x 2)     Status: None   Collection Time: 2014-12-31 12:10 PM  Result Value Ref Range Status   Specimen Description BLOOD LFA  Final   Special Requests BOTTLES DRAWN AEROBIC AND ANAEROBIC 2.5  Final   Culture NO GROWTH 5 DAYS  Final   Report Status 12/21/2014 FINAL  Final  Blood  Culture (routine x 2)     Status: None   Collection Time: 2014/12/31 12:19 PM  Result Value Ref Range Status   Specimen Description BLOOD RIGHT ANTECUBITAL  Final   Special Requests BOTTLES DRAWN AEROBIC AND ANAEROBIC 1/3  Final   Culture NO GROWTH 5 DAYS  Final   Report Status 12/19/2014 FINAL  Final  C difficile quick scan w PCR reflex ([facility] only)     Status: None   Collection Time: 12/17/14  5:38 PM  Result Value Ref Range Status   C Diff antigen POSITIVE  Final   C Diff toxin NEGATIVE  Final   C Diff interpretation REFLEX TO PCR  Final  Clostridium Difficile by PCR (not at [facility])     Status: Abnormal   Collection Time: 12/17/14  5:38 PM  Result Value Ref Range Status   C difficile by pcr POSITIVE (A) NEGATIVE Final    Comment: CRITICAL RESULT CALLED TO, READ BACK BY AND VERIFIED WITH: CYNTHIA BURGESS AT 1947 12/17/14.PMH     RADIOLOGY:  Dg Chest Port 1 View  12/18/2014   CLINICAL DATA:  Hypoxia, history of pulmonary fibrosis with chronic respiratory failure, recent admission for community acquired pneumonia, now with worsening shortness of breath, cough, and hemoptysis  EXAM: PORTABLE CHEST - 1 VIEW  COMPARISON:  12-31-2014  FINDINGS: Multifocal patchy opacities, upper lobe predominant, suspicious for multifocal pneumonia. This appearance has continued to progress from prior chest radiographs, particularly in the left upper lobe.  Underlying mild bibasilar scarring. No pleural effusion or pneumothorax.  The heart is normal in size.  IMPRESSION: Multifocal patchy opacities, upper lobe predominant, suspicious for multifocal pneumonia.  This appearance has continued to progress from prior chest radiographs, particularly in the left upper lobe.   Electronically Signed   By: Charline Bills M.D.   On: 12/18/2014 11:56   Management plans discussed with the patient, family and they are in agreement.  CODE STATUS: DNR  TOTAL TIME TAKING CARE OF THIS PATIENT: 55 minutes.    [facility], Aislee Landgren M.D on  12/30/2014 at 11:19 AM  Between 7am to 6pm - Pager - [phone]  After 6pm go to [email or web address] - password [email or web address]  [facility]  Office  [phone]  CC: Primary care physician; Hillery Aldo, MD

## 2015-01-10 NOTE — Discharge Summary (Signed)
[facility] [place] [facility]    Death Note: please see Discharge Note for all details.   In breif - 81 y.o. female with a known history of pulmonary fibrosis with chronic respiratory failure requiring 3 L of nasal cannula at all times, coronary artery disease, diastolic congestive heart failure, recent admission from June 12 to June 15 for community-acquired pneumonia readmitted for with worsening shortness of breath, cough, sputum and hemoptysis with Health-care associated pneumonia. Please see Dr. Claris CheWalsh's dated history and physical for further details. She was treated with broad-spectrum IV antibiotics. He continued to have difficulty breathing with acute on chronic respiratory failure persistently requiring BiPAP. A long discussion with patient and family, decision was made to make her DO NOT RESUSCITATE. Also, comfort care initiated. She was agreeable with hospice home and plan was to transfer her there but as soon as we took BIPAP off she decompensated acutely with worsening hypoxia. Discussed with family and Hospice nurse that she is not stable for transport to facility and will likely die here only.    Stefanie Bryan CSN:[id number],MRN:[id number] is a 81 y.o. female, Outpatient Primary MD for the patient is Hillery AldoPATEL, SARAH, MD  Pronounced dead by Nurse on 12/28/2014  @ 13:12                  Cause of death: Healthcare-associated pneumonia Severe pulmonary fibrosis CHF  [facility], Barri Neidlinger M.D on 12/16/2014 at 1:49 PM  [facility] - [place] at [facility]    OFFICE [phone]  Total clinical and documentation time for today Under 30 minutes

## 2015-01-10 NOTE — Progress Notes (Signed)
New hospice home referral received from Dr. Sherryll BurgerShah. Stefanie Bryan is an 81 year old woman with a known history of pulmonary fibrosis with chronic respiratory failure requiring 3 L of nasal cannula at all times, coronary artery disease, diastolic congestive heart failure, recent admission from June 12 to June 15 for community-acquired pneumonia readmitted for with worsening shortness of breath, cough, sputum and hemoptysis with Health-care associated pneumonia. She was treated with broad-spectrum IV antibiotics. She continued to have difficulty breathing with acute on chronic respiratory failure persistently requiring BiPAP. Attending physician Dr. Sherryll BurgerShah spoke with both the patient and her daughter Stefanie Bryan and the choice was made for her to be moved to the hospice home for comfort care. Writer spoke with Stefanie Bryan and to the patient who confirmed the choice of comfort and DNR status. Stefanie Bryan is very familiar with services at the hospice home as she has had several relatives, including her father receive care there. Consents signed, report called to hospice home, all patient info sent and faxed to referral. Writer spoke with staff RN Charli and Dr. Sherryll BurgerShah regarding transition off the bipap for transport with support of comfort medications, Stefanie Bryan as well as Stefanie Bryan also aware and in agreement.. After transition to nonrebreather patient decompensated very quickly,. Staff RN obtained orders for continued comfort medications. Writer spoke with staff RN, and family, patient too unstable for transport. Family aware and are in agreement. Emotional support offered. Referral notified that patient was too unstable for transport. Thank you. Stefanie BarkerKaren Robertson RN, BSN, [facility], [facility] [phone] c

## 2015-01-10 NOTE — Care Management Note (Signed)
Case Management Note  Patient Details  Name: Jilda Rochemmogene Mazon MRN: [id number] Date of Birth: [birth year]  Subjective/Objective:  [facility] not able to take patients on Bipap. Nurses have attempted to wean patient from BiPAP but patient O2 sats have dropped to 20's-30's. Not stable for transport to the hospice home at this time.  Plan is comfort care and removal of BiPAP. Patient on NRB at this time with O2 sats in the 20's.                 Action/Plan:   Expected Discharge Date:                  Expected Discharge Plan:     In-House Referral:     Discharge planning Services     Post Acute Care Choice:    Choice offered to:     DME Arranged:    DME Agency:     HH Arranged:    HH Agency:     Status of Service:     Medicare Important Message Given:  Yes-third notification given Date Medicare IM Given:    Medicare IM give by:    Date Additional Medicare IM Given:    Additional Medicare Important Message give by:     If discussed at Long Length of Stay Meetings, dates discussed:    Additional Comments:  Marily MemosLisa M Miaa Latterell, RN 01/01/2015, 11:34 AM

## 2015-01-10 NOTE — Progress Notes (Addendum)
This am patient was on 100% bipap sating 70%.  Pt NSR on monitor and other vitals stable- had no complaints of pain- alert and oriented.  Code status was DNI- made Dr. Sherryll BurgerShah and Dr. [place]/Dr. Mayo AoFlemming and Dr. Welton FlakesKhan aware of patient's status with bipap- Dr. Sherryll BurgerShah also made aware the patient's hgb is 7.1- Pt made self DNR status-Later Dr. Sherryll BurgerShah contacted Hospice home for patient to be transferred.  Pt unable to tolerate off bipap and was switched to non-rebreather - patient started to sat in the 40's.  Then decision was to make patient comfort care and to stay here in the ICU.- Pt sating 20% at this time- family at bedside. Keeping patient comfortable with morphine and ativan.

## 2015-01-10 NOTE — Care Management Note (Signed)
Case Management Note  Patient Details  Name: Stefanie Bryan MRN: [id number] Date of Birth: 1927/11/10  Subjective/Objective:  Chart reviewed and noted Dr. Kathie RhodesS. Patel's note regarding hospice home. Spoke with Dr. Sherryll BurgerShah this morning and her also recommended hospice home. TC to Dayna BarkerKaren Robertson with [facility] regarding referral                  Action/Plan:   Expected Discharge Date:                  Expected Discharge Plan:     In-House Referral:     Discharge planning Services     Post Acute Care Choice:    Choice offered to:     DME Arranged:    DME Agency:     HH Arranged:    HH Agency:     Status of Service:     Medicare Important Message Given:  Yes-third notification given Date Medicare IM Given:    Medicare IM give by:    Date Additional Medicare IM Given:    Additional Medicare Important Message give by:     If discussed at Long Length of Stay Meetings, dates discussed:    Additional Comments:  Marily MemosLisa M Jaylyn Booher, RN 2014-08-14, 8:55 AM

## 2015-01-10 NOTE — Progress Notes (Signed)
Increase WOB noted.  Sats mid 80's.  Remains on 100% bipap.  Spoke to Dr. Anne HahnWillis.  Morphine increased to every 2 hours.

## 2015-01-10 NOTE — Progress Notes (Addendum)
Patient passed at 13:12.- Dr. Sherryll BurgerShah and Dewayne HatchAnn Nursing Supervisior made aware.

## 2015-01-10 DEATH — deceased

## 2016-01-04 IMAGING — CR DG CHEST 2V
1 series · 2 of 2 positions shown · non-contrast
Comparison: None.

CLINICAL DATA: Chest pain radiating to the left. Initial encounter.

EXAM:
CHEST  2 VIEW

[Series 1: dxr chest pa (or ap) and lateral · 0.14mm/px · 2 of 2 slices shown]
[im 1/2]
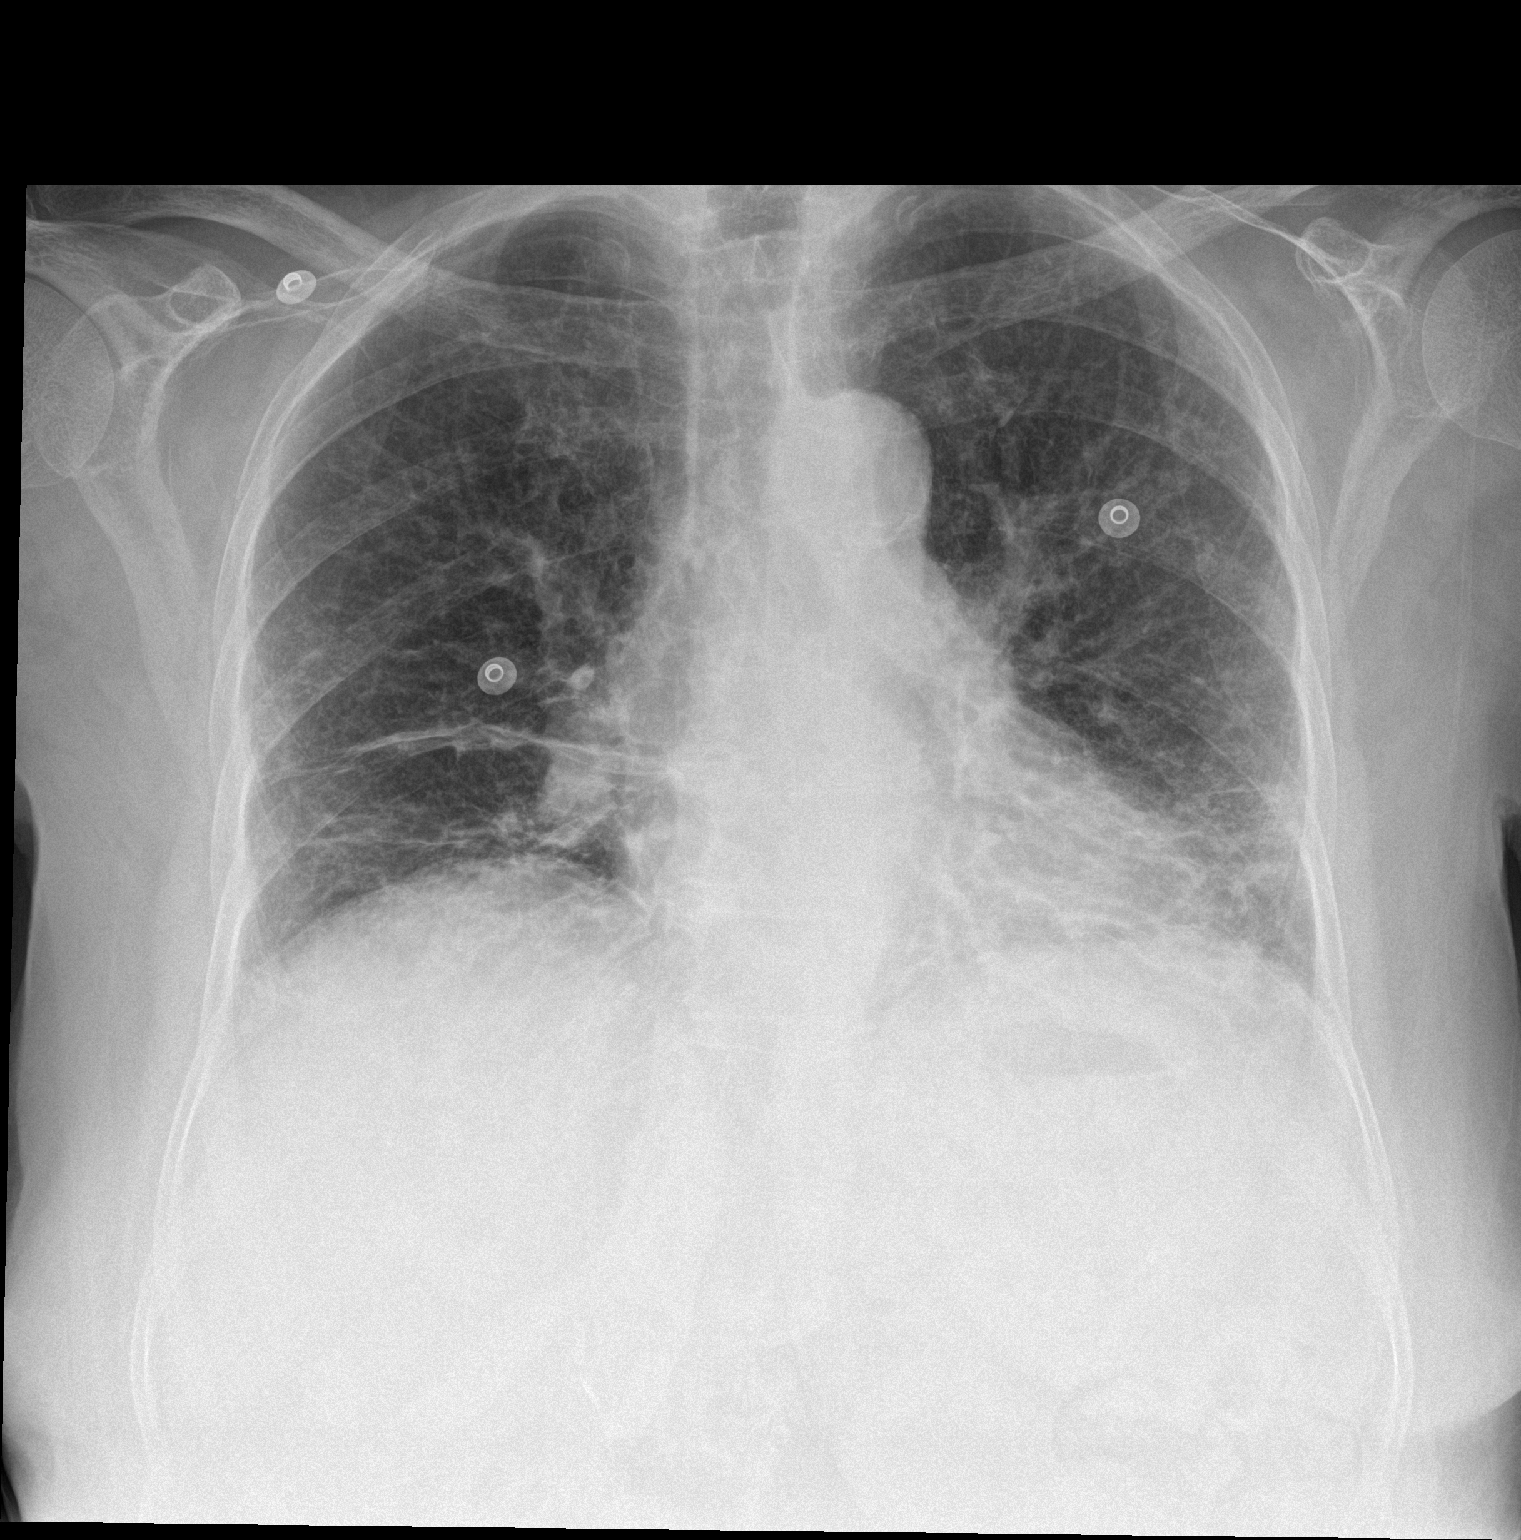
[im 2/2]
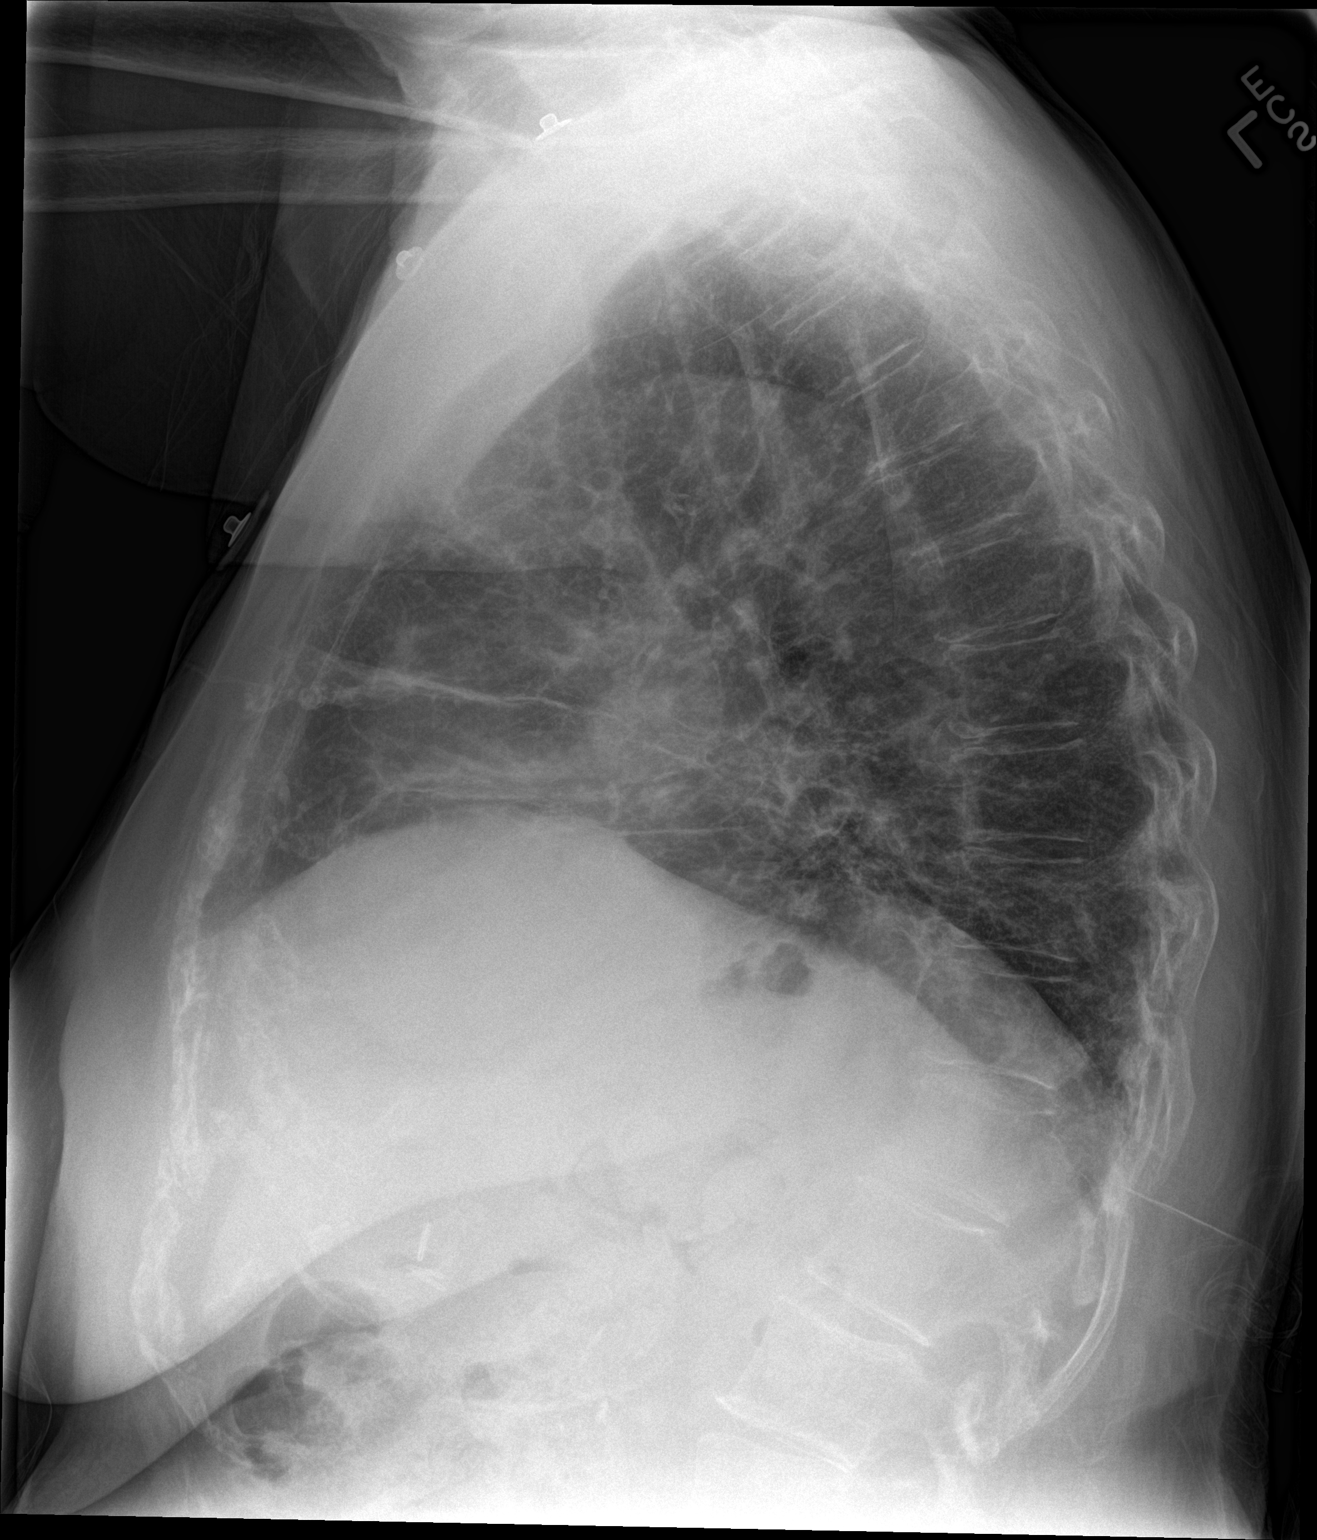

[2 of 2 positions shown; findings below may reference images not displayed]

FINDINGS: Borderline enlarged cardiac silhouette and mediastinal contours,
possibly accentuated due to decreased lung volumes. Atherosclerotic
plaque within the thoracic aorta. Evaluation of the retrosternal
clear space is obscured secondary overlying soft tissues. Minimal
bibasilar linear heterogeneous and slightly reticular opacities,
left greater than right. There is diffuse nodular thickening of the
pulmonary interstitium. No pleural effusion or pneumothorax. No
evidence of edema. No acute osseus abnormalities. Post
cholecystectomy.
IMPRESSION: 1. Cardiomegaly and advanced bronchitic change without definite
acute cardiopulmonary disease.
2. Bibasilar linear and slightly reticular opacities, left greater
than right, possibly atelectasis or scar though pulmonary fibrosis
could have a similar appearance. Correlation with prior outside
examinations is recommended. Otherwise, further evaluation could be
performed with nonemergent chest CT as clinically indicated.

## 2016-04-02 IMAGING — CR DG CHEST 2V
1 series · 3 of 3 positions shown · non-contrast
Comparison: PA and lateral chest 03/24/2014 and 03/09/2014. Single
view of the chest 08/15/2013.

CLINICAL DATA: Cough and shortness of breath for 3 weeks. History
of COPD.

EXAM:
CHEST  2 VIEW

[Series 1: dxr chest pa (or ap) and lateral · 0.14mm/px · 3 of 3 slices shown]
[im 1/3]
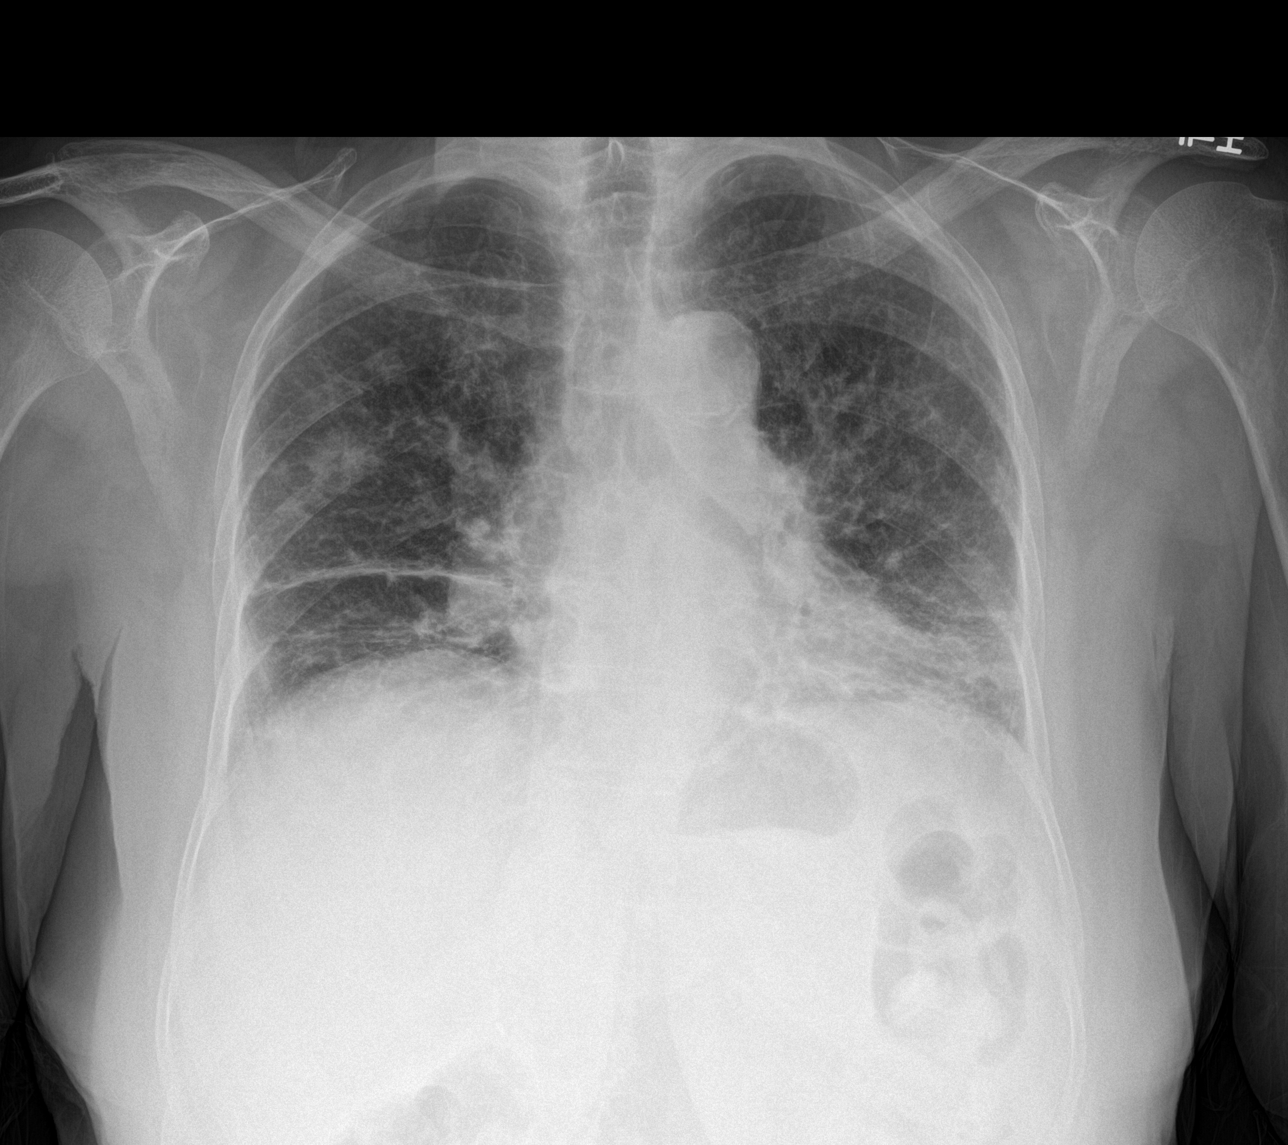
[im 2/3]
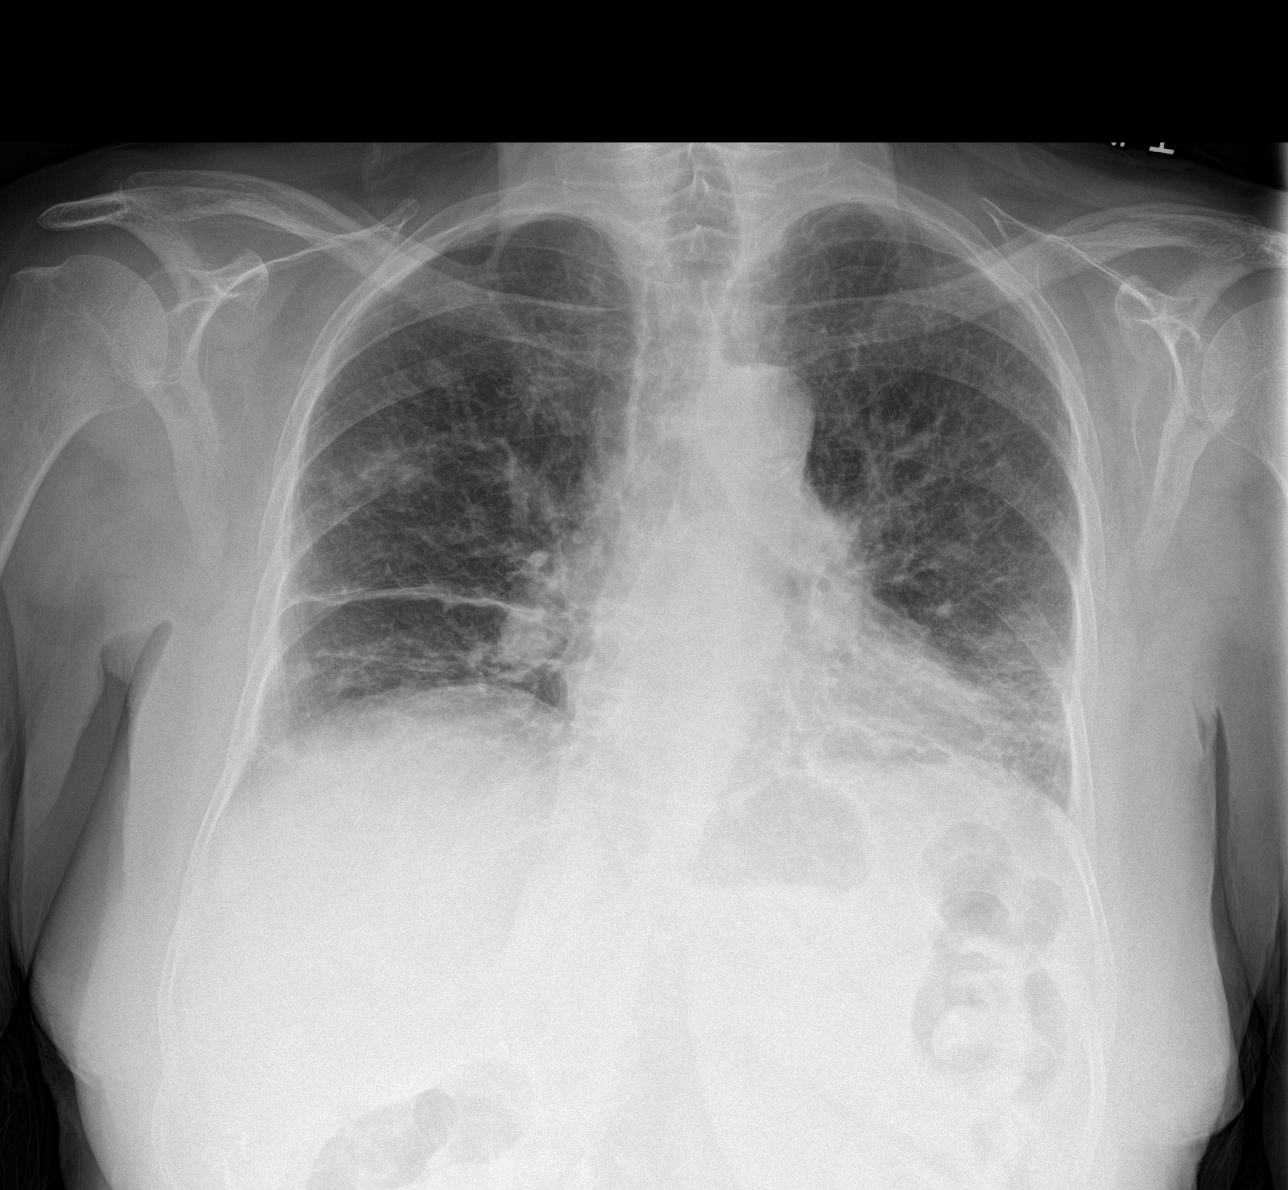
[im 3/3]
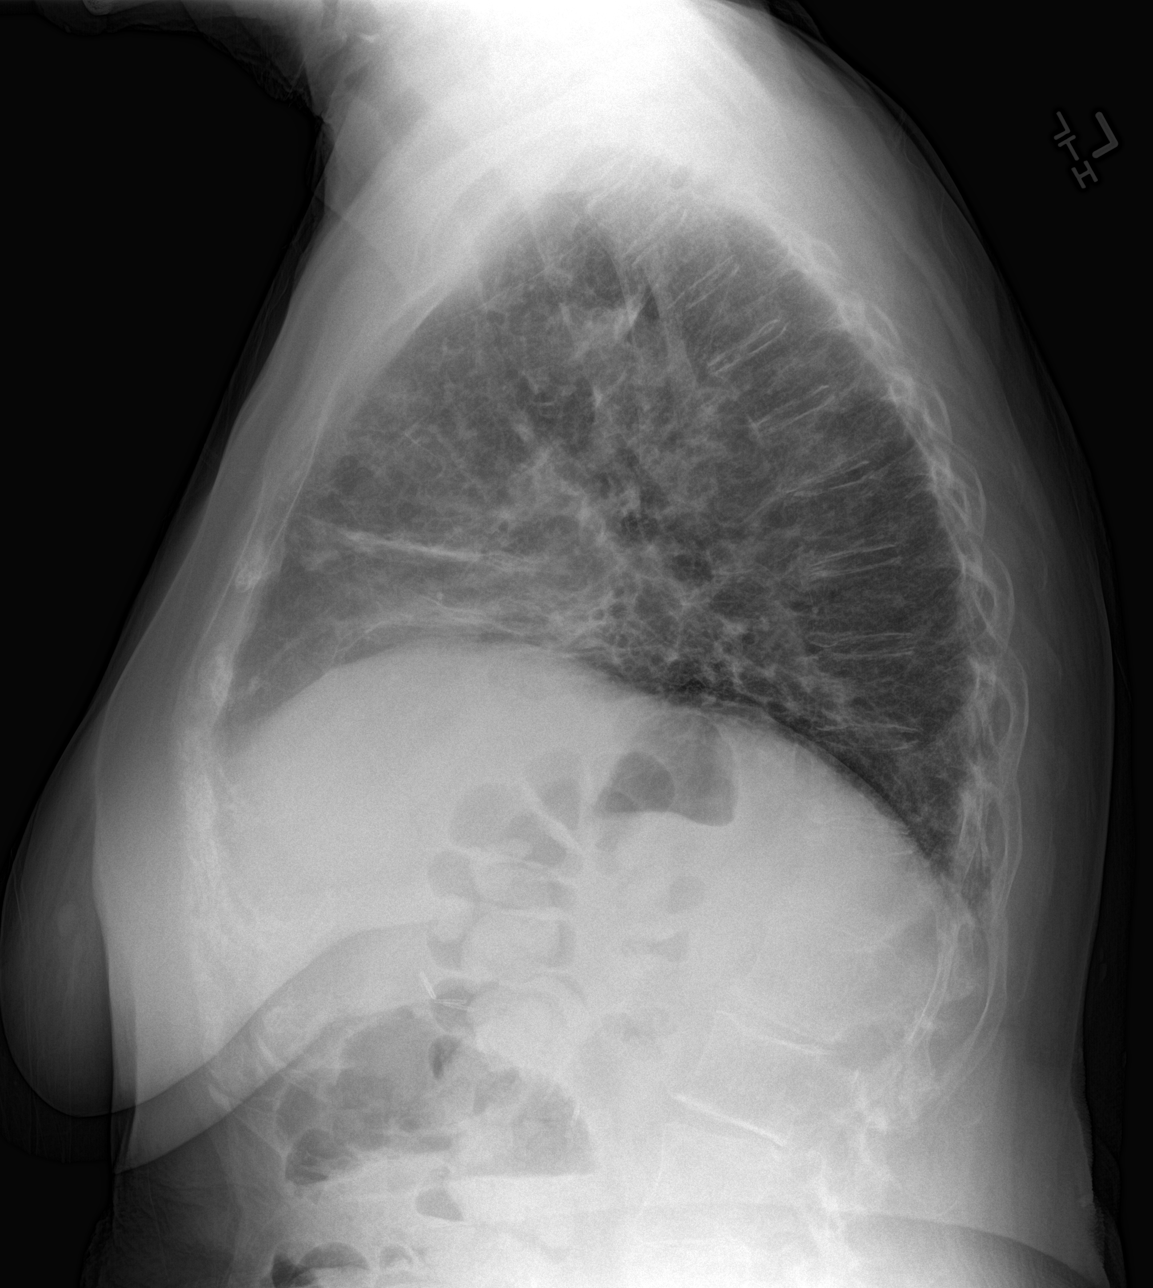

[3 of 3 positions shown; findings below may reference images not displayed]

FINDINGS: Bibasilar scarring is unchanged. Changes of pulmonary fibrosis are
identified. There is a new opacity projecting in the right upper
lobe on the frontal view only. The appearance of the left lung is
unchanged. Heart size is normal.
IMPRESSION: New focal opacity in the right upper lobe could be secondary to
pneumonia. Short-term follow-up radiographs are recommended to
ensure clearing. If the abnormality does not clear, CT chest with
contrast is recommended.

Pulmonary fibrosis and bibasilar scarring.

## 2016-06-14 IMAGING — CR DG CHEST 1V PORT
1 series · 1 of 1 positions shown · non-contrast
Comparison: Radiographs 08/15/2014, chest CT 08/06/2014

CLINICAL DATA: Chest pain for 1 night.

EXAM:
PORTABLE CHEST - 1 VIEW

[ap]
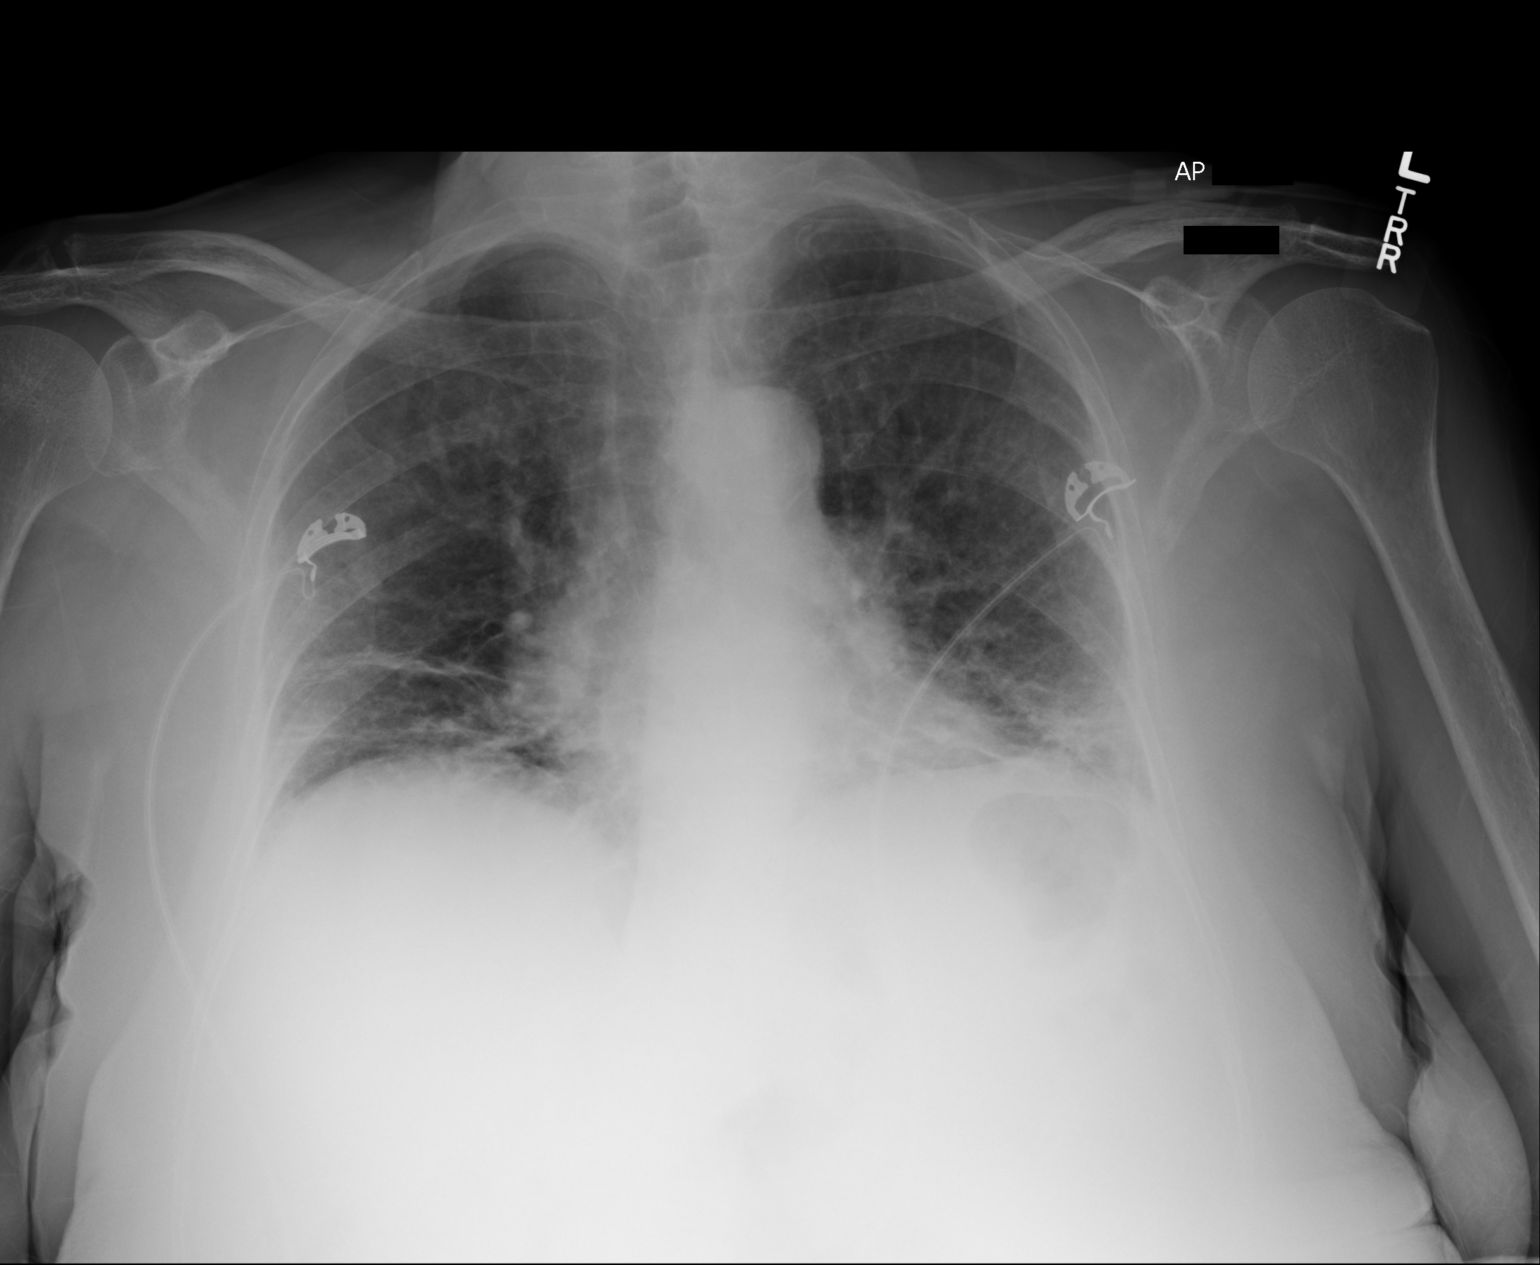

[1 of 1 positions shown; findings below may reference images not displayed]

FINDINGS: Lung volumes are low. Heart size and mediastinal contours are
unchanged. Persistence linear bibasilar opacities, may reflect
scarring. No new consolidation. No pulmonary edema. No large pleural
effusion or pneumothorax on this portable view.
IMPRESSION: Hypoventilatory chest with bibasilar scarring. No new pulmonary
process.
